# Patient Record
Sex: Male | Born: 1945
Health system: Southern US, Community
[De-identification: ages and names within clinical notes are randomized; demographics above are authoritative.]

## PROBLEM LIST (undated history)

## (undated) DIAGNOSIS — F0390 Unspecified dementia without behavioral disturbance: Secondary | ICD-10-CM

## (undated) DIAGNOSIS — Z973 Presence of spectacles and contact lenses: Secondary | ICD-10-CM

## (undated) DIAGNOSIS — N529 Male erectile dysfunction, unspecified: Secondary | ICD-10-CM

## (undated) DIAGNOSIS — Z87438 Personal history of other diseases of male genital organs: Secondary | ICD-10-CM

## (undated) DIAGNOSIS — M1990 Unspecified osteoarthritis, unspecified site: Secondary | ICD-10-CM

## (undated) DIAGNOSIS — N393 Stress incontinence (female) (male): Secondary | ICD-10-CM

## (undated) DIAGNOSIS — Z9889 Other specified postprocedural states: Secondary | ICD-10-CM

## (undated) DIAGNOSIS — J189 Pneumonia, unspecified organism: Secondary | ICD-10-CM

## (undated) DIAGNOSIS — R972 Elevated prostate specific antigen [PSA]: Secondary | ICD-10-CM

## (undated) DIAGNOSIS — IMO0001 Reserved for inherently not codable concepts without codable children: Secondary | ICD-10-CM

## (undated) DIAGNOSIS — C61 Malignant neoplasm of prostate: Secondary | ICD-10-CM

## (undated) DIAGNOSIS — J45909 Unspecified asthma, uncomplicated: Secondary | ICD-10-CM

## (undated) DIAGNOSIS — Z85828 Personal history of other malignant neoplasm of skin: Secondary | ICD-10-CM

---

## 2004-02-19 ENCOUNTER — Ambulatory Visit (HOSPITAL_COMMUNITY): Admission: RE | Admit: 2004-02-19 | Discharge: 2004-02-19 | Payer: Self-pay | Admitting: Gastroenterology

## 2004-02-19 HISTORY — PX: COLONOSCOPY: SHX174

## 2004-08-24 LAB — HM COLONOSCOPY: HM Colonoscopy: NORMAL

## 2004-09-30 ENCOUNTER — Ambulatory Visit: Payer: Self-pay | Admitting: Sports Medicine

## 2005-05-08 ENCOUNTER — Ambulatory Visit: Payer: Self-pay | Admitting: Family Medicine

## 2005-12-09 ENCOUNTER — Ambulatory Visit: Payer: Self-pay | Admitting: Family Medicine

## 2006-04-30 ENCOUNTER — Ambulatory Visit: Payer: Self-pay | Admitting: Family Medicine

## 2006-08-23 ENCOUNTER — Ambulatory Visit: Payer: Self-pay | Admitting: Family Medicine

## 2006-08-27 ENCOUNTER — Ambulatory Visit: Payer: Self-pay | Admitting: Family Medicine

## 2007-05-24 ENCOUNTER — Encounter: Payer: Self-pay | Admitting: Family Medicine

## 2007-05-24 DIAGNOSIS — J45909 Unspecified asthma, uncomplicated: Secondary | ICD-10-CM | POA: Insufficient documentation

## 2007-05-24 DIAGNOSIS — J309 Allergic rhinitis, unspecified: Secondary | ICD-10-CM | POA: Insufficient documentation

## 2007-06-23 ENCOUNTER — Telehealth: Payer: Self-pay | Admitting: Family Medicine

## 2007-06-27 ENCOUNTER — Telehealth: Payer: Self-pay | Admitting: Family Medicine

## 2007-08-23 ENCOUNTER — Ambulatory Visit: Payer: Self-pay | Admitting: Family Medicine

## 2007-08-23 DIAGNOSIS — H1030 Unspecified acute conjunctivitis, unspecified eye: Secondary | ICD-10-CM

## 2007-08-26 ENCOUNTER — Ambulatory Visit: Payer: Self-pay | Admitting: Family Medicine

## 2007-10-03 ENCOUNTER — Ambulatory Visit: Payer: Self-pay | Admitting: Family Medicine

## 2010-06-03 ENCOUNTER — Telehealth: Payer: Self-pay | Admitting: Family Medicine

## 2010-06-03 DIAGNOSIS — Z85820 Personal history of malignant melanoma of skin: Secondary | ICD-10-CM | POA: Insufficient documentation

## 2010-09-24 NOTE — Progress Notes (Signed)
Summary: pt needs INS referral  Phone Note Call from Patient Call back at Home Phone [phone] Call back at [phone]   Caller: Patient Call For: Nelwyn Salisbury MD Summary of Call: Pt needs a referral to see dr Romeo Apple turner for complete skin check. Pt had melanoma on chest. Initial call taken by: Heron Sabins,  June 03, 2010 2:24 PM  Follow-up for Phone Call        please do so Follow-up by: Nelwyn Salisbury MD,  June 03, 2010 4:42 PM  Additional Follow-up for Phone Call Additional follow up Details #1::        done  Additional Follow-up by: Pura Spice, RN,  June 03, 2010 4:48 PM  New Problems: MELANOMA, HX OF (ICD-V10.82)   New Problems: MELANOMA, HX OF (ICD-V10.82)

## 2010-10-10 ENCOUNTER — Encounter: Payer: Self-pay | Admitting: Family Medicine

## 2010-10-13 ENCOUNTER — Ambulatory Visit (INDEPENDENT_AMBULATORY_CARE_PROVIDER_SITE_OTHER): Payer: Managed Care, Other (non HMO) | Admitting: Family Medicine

## 2010-10-13 ENCOUNTER — Encounter: Payer: Self-pay | Admitting: Family Medicine

## 2010-10-13 VITALS — BP 132/80 | HR 84 | Temp 98.2°F

## 2010-10-13 DIAGNOSIS — L219 Seborrheic dermatitis, unspecified: Secondary | ICD-10-CM

## 2010-10-13 MED ORDER — CICLOPIROX 1 % EX SHAM: 5.0000 mL | MEDICATED_SHAMPOO | Freq: Every day | CUTANEOUS | Status: AC

## 2010-10-13 NOTE — Progress Notes (Signed)
  Subjective:    Patient ID: [name], male    DOB: [birth year], 65 y.o.   MRN: [id number]  HPI Here for 6 month so itching and flaking of the scalp, the ear canals, and the eyebrows. Using Neutragena T Sol with no improvement.    Review of Systems  Constitutional: Negative.   Skin: Positive for rash.       Objective:   Physical Exam  Constitutional: He appears well-developed and well-nourished.  Skin:       Mild flaking of the scalp and eyebrows. No hair loss or erythema.          Assessment & Plan:  Consistent with seborrhea.

## 2011-01-09 NOTE — Op Note (Signed)
NAME:  [name], [name] NO.:  [id number]   MEDICAL RECORD NO.:  [id number]                   PATIENT TYPE:  AMB   LOCATION:  ENDO                                 FACILITY:  [facility]   PHYSICIAN:  John C. Madilyn Fireman, M.D.                 DATE OF BIRTH:  Jul 07, 1946   DATE OF PROCEDURE:  02/19/2004  DATE OF DISCHARGE:                                 OPERATIVE REPORT   PROCEDURE:  Colonoscopy.   INDICATIONS FOR PROCEDURE:  Rectal bleeding.   DESCRIPTION OF PROCEDURE:  The patient was placed in the left lateral  decubitus position then placed on the pulse monitor with continuous low flow  oxygen delivered by nasal cannula. He was sedated with 100 mcg IV fentanyl  and 8 mg IV Versed. The Olympus video colonoscope was inserted into the  rectum and advanced to the cecum, confirmed by transillumination at  McBurney's point and visualization at the ileocecal valve and appendiceal  orifice. The prep was excellent. The cecum, ascending, transverse,  descending and sigmoid colon all appeared normal with no masses, polyps,  diverticula or other mucosal abnormalities. The rectum appeared normal and  retroflexed view of the anus revealed no obvious internal hemorrhoids, but  there were enlarged tender external hemorrhoids appreciated at the beginning  of the procedure with no blood appreciated during the procedure.  The  patient tolerated the procedure well and there were no immediate  complications.   IMPRESSION:  External hemorrhoids otherwise normal colonoscopy with rectal  bleeding almost certainly from hemorrhoids.   PLAN:  Treat hemorrhoids symptomatically.                                               John C. Madilyn Fireman, M.D.    [facility]MED[facility]  D:  02/19/2004  T:  02/19/2004  Job:  [id number]   cc:   Maxwell Caul, M.D.  [address]  [place]  [place] [address]  Fax: [phone]   Bertram Millard. Hyacinth Meeker, M.D.  [address]  [place]  [place] [address]  Fax: [phone]

## 2011-06-02 ENCOUNTER — Telehealth: Payer: Self-pay | Admitting: *Deleted

## 2011-06-02 DIAGNOSIS — L219 Seborrheic dermatitis, unspecified: Secondary | ICD-10-CM

## 2011-06-02 NOTE — Telephone Encounter (Signed)
Tell him I referred him to Dermatology for this

## 2011-06-02 NOTE — Telephone Encounter (Signed)
Notified pt. 

## 2011-06-02 NOTE — Telephone Encounter (Signed)
Pt call stating the Ciclopirox Shampoo is not helping his scalp condition and asking for something different.

## 2011-06-24 ENCOUNTER — Telehealth: Payer: Self-pay | Admitting: *Deleted

## 2011-06-24 DIAGNOSIS — L989 Disorder of the skin and subcutaneous tissue, unspecified: Secondary | ICD-10-CM

## 2011-06-24 NOTE — Telephone Encounter (Signed)
The referral was done  

## 2011-06-24 NOTE — Telephone Encounter (Signed)
Pt is requesting a referral to [facility] for a full body scan on July 14, 2011 at 10: 50 am.

## 2011-06-24 NOTE — Telephone Encounter (Signed)
Spoke with pt

## 2011-08-11 ENCOUNTER — Telehealth: Payer: Self-pay

## 2011-08-11 NOTE — Telephone Encounter (Signed)
Pt called and stated he has been going to [facility] for a carcinoma that is being removed from his scalp on 08/12/11. Camelia Eng will fax pt's information to [facility].  A referral was placed on 06/24/2011.

## 2012-02-12 ENCOUNTER — Telehealth: Payer: Self-pay | Admitting: Family Medicine

## 2012-02-12 NOTE — Telephone Encounter (Signed)
Caller: [name]/Patient; PCP: Nelwyn Salisbury.; CB#: [phone]; ;  Call regarding Has A. Mole That Does Not Look Right and Is Concerned;  He reports that he has a history of Basal Cell Carcinoma - on his chest 2011 and 2012.  He was under the care Dr. Durene Cal at [facility]. With Mole removal. No other treatment required.  He states he has a mole  and just noted its appearence this week 02/08/12. It is very dark in color. Located on the right side of chest above the collar bone. Size - patient describes "between pencil  lead and eraser".  No drainage, no bleeding, no pain, non tender, no itching. Emergent s/sx ruled out per Skin Lesions Protocol with exception of "any new mole" . See in two weeks.  Advised caller I will send Message to the office requesting a follow up.   Cell [phone]  Or home [phone]

## 2012-03-01 ENCOUNTER — Encounter: Payer: Self-pay | Admitting: Family Medicine

## 2012-03-01 ENCOUNTER — Ambulatory Visit (INDEPENDENT_AMBULATORY_CARE_PROVIDER_SITE_OTHER): Payer: Medicare Other | Admitting: Family Medicine

## 2012-03-01 VITALS — BP 130/80 | HR 63 | Temp 98.0°F | Wt 183.0 lb

## 2012-03-01 DIAGNOSIS — L989 Disorder of the skin and subcutaneous tissue, unspecified: Secondary | ICD-10-CM

## 2012-03-02 ENCOUNTER — Encounter: Payer: Self-pay | Admitting: Family Medicine

## 2012-03-02 NOTE — Progress Notes (Signed)
  Subjective:    Patient ID: [name], male    DOB: 05-18-1946, 66 y.o.   MRN: [id number]  HPI Here to check a lesion on the right upper chest that he noticed about 2 weeks ago. He has had 2 basal cell cancers removed, and he is worried about this spot. He sees Dr. Karlyn Agee once a year for skin checks.   Review of Systems  Constitutional: Negative.        Objective:   Physical Exam  Constitutional: He appears well-developed and well-nourished.  Skin:       The right upper chest has a papular lesion which is about 3 mm in diameter. It is well marginated. It has a dark, bluish black color and is surrounded by a thin zone of inflammation           Assessment & Plan:  This is worrisome and could be a melanoma. I advised him to see Dr. Yetta Barre ASAP to have this removed. He will set this up.

## 2012-03-03 DIAGNOSIS — L570 Actinic keratosis: Secondary | ICD-10-CM | POA: Diagnosis not present

## 2012-03-03 DIAGNOSIS — L820 Inflamed seborrheic keratosis: Secondary | ICD-10-CM | POA: Diagnosis not present

## 2012-07-12 DIAGNOSIS — L219 Seborrheic dermatitis, unspecified: Secondary | ICD-10-CM | POA: Diagnosis not present

## 2012-07-12 DIAGNOSIS — Z85828 Personal history of other malignant neoplasm of skin: Secondary | ICD-10-CM | POA: Diagnosis not present

## 2012-07-12 DIAGNOSIS — B356 Tinea cruris: Secondary | ICD-10-CM | POA: Diagnosis not present

## 2012-07-12 DIAGNOSIS — D239 Other benign neoplasm of skin, unspecified: Secondary | ICD-10-CM | POA: Diagnosis not present

## 2012-07-12 DIAGNOSIS — L819 Disorder of pigmentation, unspecified: Secondary | ICD-10-CM | POA: Diagnosis not present

## 2012-07-12 DIAGNOSIS — D1801 Hemangioma of skin and subcutaneous tissue: Secondary | ICD-10-CM | POA: Diagnosis not present

## 2012-07-26 DIAGNOSIS — H43819 Vitreous degeneration, unspecified eye: Secondary | ICD-10-CM | POA: Diagnosis not present

## 2013-04-25 ENCOUNTER — Telehealth: Payer: Self-pay | Admitting: Family Medicine

## 2013-04-25 DIAGNOSIS — IMO0002 Reserved for concepts with insufficient information to code with codable children: Secondary | ICD-10-CM | POA: Diagnosis not present

## 2013-04-25 NOTE — Telephone Encounter (Signed)
I spoke with pt and he is going to a Urgent Care, per advise from Dr. Clent Ridges.
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
## 2013-05-02 ENCOUNTER — Encounter: Payer: Self-pay | Admitting: Family Medicine

## 2013-05-02 ENCOUNTER — Ambulatory Visit (INDEPENDENT_AMBULATORY_CARE_PROVIDER_SITE_OTHER): Payer: Medicare Other | Admitting: Family Medicine

## 2013-05-02 VITALS — BP 150/80 | HR 95 | Temp 97.8°F | Wt 179.0 lb

## 2013-05-02 DIAGNOSIS — R413 Other amnesia: Secondary | ICD-10-CM | POA: Diagnosis not present

## 2013-05-02 DIAGNOSIS — I10 Essential (primary) hypertension: Secondary | ICD-10-CM | POA: Diagnosis not present

## 2013-05-02 LAB — POCT URINALYSIS DIPSTICK
Bilirubin, UA: NEGATIVE
Blood, UA: NEGATIVE
Glucose, UA: NEGATIVE
Nitrite, UA: NEGATIVE
Urobilinogen, UA: 1

## 2013-05-02 LAB — CBC WITH DIFFERENTIAL/PLATELET
Basophils Relative: 0.4 % (ref 0.0–3.0)
Eosinophils Relative: 3.1 % (ref 0.0–5.0)
Lymphocytes Relative: 24.6 % (ref 12.0–46.0)
MCV: 93 fl (ref 78.0–100.0)
Monocytes Absolute: 0.4 10*3/uL (ref 0.1–1.0)
Monocytes Relative: 5.8 % (ref 3.0–12.0)
Neutrophils Relative %: 66.1 % (ref 43.0–77.0)
Platelets: 373 10*3/uL (ref 150.0–400.0)
RBC: 4.71 Mil/uL (ref 4.22–5.81)
WBC: 6.4 10*3/uL (ref 4.5–10.5)

## 2013-05-02 LAB — HEPATIC FUNCTION PANEL
Albumin: 4.3 g/dL (ref 3.5–5.2)
Alkaline Phosphatase: 63 U/L (ref 39–117)
Bilirubin, Direct: 0.1 mg/dL (ref 0.0–0.3)
Total Protein: 7.7 g/dL (ref 6.0–8.3)

## 2013-05-02 LAB — BASIC METABOLIC PANEL
Calcium: 9.7 mg/dL (ref 8.4–10.5)
Creatinine, Ser: 1 mg/dL (ref 0.4–1.5)
GFR: 81.24 mL/min (ref >60.00)
Glucose, Bld: 120 mg/dL — ABNORMAL HIGH (ref 70–99)
Sodium: 139 mEq/L (ref 135–145)

## 2013-05-02 LAB — TSH: TSH: 1.58 u[IU]/mL (ref 0.35–5.50)

## 2013-05-02 MED ORDER — LISINOPRIL 10 MG PO TABS: 10.0000 mg | ORAL_TABLET | Freq: Every day | ORAL | Status: DC

## 2013-05-02 NOTE — Progress Notes (Signed)
  Subjective:    Patient ID: [name], male    DOB: Oct 20, 1945, 67 y.o.   MRN: [id number]  HPI Here for 2 things. First he is concerned about his BP. He was in Urgent Care a few weeks ago for some abrasions after a fall, and his BP that day was 160/90. He has been checking this every few days at his pharmacy since then, and he is consistently getting ranges of 145-160 over 80-94. He feels fine. He eats a very healthy diet and he exercises daily. He does not use tobacco. His father had HTN as he got older. The second issue is his memory. He has noticed a growing problem with forgetting things the past few years and he is very worried about getting dementia. His mother, her sister, and her 2 brothers all had dementia.    Review of Systems  Constitutional: Negative.   Respiratory: Negative.   Cardiovascular: Negative.   Neurological: Negative.        Objective:   Physical Exam  Constitutional: He is oriented to person, place, and time. He appears well-developed and well-nourished.  Neck: No thyromegaly present.  Cardiovascular: Normal rate, regular rhythm, normal heart sounds and intact distal pulses.   Pulmonary/Chest: Effort normal and breath sounds normal.  Lymphadenopathy:    He has no cervical adenopathy.  Neurological: He is alert and oriented to person, place, and time.          Assessment & Plan:  We will start treating the HTN with Lisinopril. Get labs today. Recheck with a cpx in one month. We will refer to Neurology for evaluation.

## 2013-05-04 ENCOUNTER — Encounter: Payer: Self-pay | Admitting: Family Medicine

## 2013-05-04 ENCOUNTER — Ambulatory Visit (INDEPENDENT_AMBULATORY_CARE_PROVIDER_SITE_OTHER): Payer: Medicare Other | Admitting: Family Medicine

## 2013-05-04 VITALS — BP 140/90 | HR 82 | Temp 98.2°F | Wt 179.0 lb

## 2013-05-04 DIAGNOSIS — R21 Rash and other nonspecific skin eruption: Secondary | ICD-10-CM | POA: Diagnosis not present

## 2013-05-04 DIAGNOSIS — I10 Essential (primary) hypertension: Secondary | ICD-10-CM | POA: Diagnosis not present

## 2013-05-04 NOTE — Progress Notes (Signed)
  Subjective:    Patient ID: [name], male    DOB: 06-12-1946, 67 y.o.   MRN: [id number]  HPI Here for the onset yesterday of a rash on both forearms. No itching or pain. No SOB. He is on day 8 of Doxycycline for cellulitis on the knee from an abrasion, and he is on day 3 of Lisinopril for HTN. He had never taken either of these before.    Review of Systems  Constitutional: Negative.   Respiratory: Negative.   Cardiovascular: Negative.   Skin: Positive for rash.       Objective:   Physical Exam  Constitutional: He appears well-developed and well-nourished.  HENT:  Face and lips are not swollen   Cardiovascular: Normal rate, regular rhythm, normal heart sounds and intact distal pulses.   Pulmonary/Chest: Effort normal and breath sounds normal.  Skin:  Macular faint mottled red rash on both inner forearms           Assessment & Plan:  This looks like an allergic reaction, and the timing would suggest it is from the Doxycycline rather than from the Lisinopril. The cellulitis has resolved so he will stop the Doxycycline immediately. He will continue the Lisinopril. If the rash gets worse he will contact us.

## 2013-05-06 ENCOUNTER — Encounter (HOSPITAL_COMMUNITY): Payer: Self-pay

## 2013-05-06 ENCOUNTER — Emergency Department (HOSPITAL_COMMUNITY)
Admission: EM | Admit: 2013-05-06 | Discharge: 2013-05-06 | Disposition: A | Discharge status: Home / Self Care | Payer: Medicare Other | Attending: Emergency Medicine | Admitting: Emergency Medicine

## 2013-05-06 DIAGNOSIS — M629 Disorder of muscle, unspecified: Secondary | ICD-10-CM | POA: Diagnosis not present

## 2013-05-06 DIAGNOSIS — Z87891 Personal history of nicotine dependence: Secondary | ICD-10-CM | POA: Insufficient documentation

## 2013-05-06 DIAGNOSIS — Z79899 Other long term (current) drug therapy: Secondary | ICD-10-CM | POA: Insufficient documentation

## 2013-05-06 DIAGNOSIS — Z880 Allergy status to penicillin: Secondary | ICD-10-CM | POA: Diagnosis not present

## 2013-05-06 DIAGNOSIS — J45909 Unspecified asthma, uncomplicated: Secondary | ICD-10-CM | POA: Diagnosis not present

## 2013-05-06 DIAGNOSIS — R253 Fasciculation: Secondary | ICD-10-CM

## 2013-05-06 DIAGNOSIS — M2420 Disorder of ligament, unspecified site: Secondary | ICD-10-CM | POA: Insufficient documentation

## 2013-05-06 DIAGNOSIS — R259 Unspecified abnormal involuntary movements: Secondary | ICD-10-CM | POA: Diagnosis not present

## 2013-05-06 LAB — POCT I-STAT, CHEM 8
BUN: 11 mg/dL (ref 6–23)
Calcium, Ion: 1.23 mmol/L (ref 1.13–1.30)
Creatinine, Ser: 1 mg/dL (ref 0.50–1.35)
TCO2: 27 mmol/L (ref 0–100)

## 2013-05-06 NOTE — ED Provider Notes (Signed)
CSN: [id number]     Arrival date & time 05/06/13  0506 History   First MD Initiated Contact with Patient 05/06/13 [phone]     Chief Complaint  Patient presents with  . Arm Pain  . Muscle Twitching    (Consider location/radiation/quality/duration/timing/severity/associated sxs/prior Treatment) Patient is a 67 y.o. male presenting with arm pain. The history is provided by the patient. No language interpreter was used.  Arm Pain This is a new problem. The current episode started today.  Pt is a 67 year old male who awoke this morning around 0100 due to difficulty sleeping. He subsequently had left arm pain and muscle twitching in his legs. He denies chest pain, shortness of breath, nausea, difficulty breathing, chest pressure, diaphoresis and recent illness. He reports that he feels fine now however, he is leaving to go on a cruise this afternoon and wants to "get checked out". He reports that he started Lisinopril 10mg  po this last Tuesday and this is the first anti-hypertensive that he has ever taken. He also has a resolving wound on his left knee and left elbow.  Past Medical History  Diagnosis Date  . Allergy   . Asthma    Past Surgical History  Procedure Laterality Date  . Colonoscopy  08/24/04  . Basal cell carcinoma excision      twice per Dr. Karlyn Agee, one on chest and one on scalp   History reviewed. No pertinent family history. History  Substance Use Topics  . Smoking status: Former Games developer  . Smokeless tobacco: Never Used  . Alcohol Use: 1.5 oz/week    3 drink(s) per week    Review of Systems  Musculoskeletal:       Muscles in thighs twitching, left arm pain  All other systems reviewed and are negative.    Allergies  Penicillins and Doxycycline  Home Medications   Current Outpatient Rx  Name  Route  Sig  Dispense  Refill  . lisinopril (PRINIVIL,ZESTRIL) 10 MG tablet   Oral   Take 1 tablet (10 mg total) by mouth daily.   30 tablet   2   . doxycycline  (VIBRA-TABS) 100 MG tablet   Oral   Take 100 mg by mouth 2 (two) times daily.          BP 148/84  Pulse 64  Temp(Src) 98.1 F (36.7 C) (Oral)  Resp 16  SpO2 100% Physical Exam  Constitutional: He is oriented to person, place, and time. He appears well-developed and well-nourished.  HENT:  Head: Normocephalic and atraumatic.  Right Ear: External ear normal.  Left Ear: External ear normal.  Mouth/Throat: Oropharynx is clear and moist.  Eyes: Pupils are equal, round, and reactive to light.  Neck: Normal range of motion. Neck supple.  Cardiovascular: Normal rate, regular rhythm, normal heart sounds and intact distal pulses.   Pulmonary/Chest: Effort normal and breath sounds normal.  Abdominal: Soft. Bowel sounds are normal.  Musculoskeletal: Normal range of motion.  Neurological: He is alert and oriented to person, place, and time.  Skin: Skin is warm and dry.  Psychiatric: He has a normal mood and affect. His behavior is normal. Thought content normal.    ED Course  Procedures (including critical care time) Labs Review Labs Reviewed - No data to display Imaging Review No results found.    05/06/2013   Date: 05/06/2013  Rate: 66   Rhythm: normal sinus rhythm  QRS Axis: normal  Intervals: normal  ST/T Wave abnormalities: normal  Conduction Disutrbances:none  Narrative Interpretation:   Old EKG Reviewed: none available     MDM   1. Muscle twitching      Plan: Follow up with PCP on return from [place] trip for already scheduled PE Reassuring history, negative cardiac markers and normal EKG     Irish Elders, NP 05/06/13 [phone]
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
## 2013-05-06 NOTE — ED Notes (Signed)
Ordered ekg d/t sudden onset arm pain with no obvious injury and elevated b/p.

## 2013-05-06 NOTE — ED Notes (Signed)
Pt states he began on blood pressure medication 3 days ago and he thinks these symptoms may be related to blood pressure medication.  Legs trembling and pain under left arm

## 2013-05-06 NOTE — ED Provider Notes (Signed)
Medical screening examination/treatment/procedure(s) were performed by non-physician practitioner and as supervising physician I was immediately available for consultation/collaboration.   Glynn Octave, MD 05/06/13 [phone]

## 2013-05-08 ENCOUNTER — Telehealth: Payer: Self-pay | Admitting: Family Medicine

## 2013-05-08 NOTE — Telephone Encounter (Signed)
[address] [place], [place] [address] p. [phone] f. [phone] To: [facility] (After Hours Triage) Fax: [phone] From: [facility] Date/ Time: 05/07/2013 1:28 PM Taken By: Jethro BolusMikle Bosworth Facility: home Patient: [name] DOB: 1946-01-25 Phone: [phone] Reason for Call: ATTENTION: !!! [name] (DOB: 1945-11-18) called on Sunday (May 07, 2013 at 1:10 PM) he asked to me to fax a note to Alert Dr. Clent Ridges that was seen in [facility] ED on May 06 2013 in the early AM with a reaction to the Lisinopril. [name] said he was thoroughly checked out and they determined his body was adjusting to the medication and advised to continue taking the Lisinopril. The patient is leaving today (May 07, 2013) for a Cruise and will not be available for contact, and if he had any issues he would see the doctor on the ship. Regarding Appointment: Appt Date: Appt Time: Unknown Confidential Provider: Reason: Details: Outcome:

## 2013-05-08 NOTE — Telephone Encounter (Signed)
noted 

## 2013-05-08 NOTE — Telephone Encounter (Signed)
[facility] Triage Call Report Triage Record Num: [id number] Operator: Peri Jefferson Patient Name: Isamu Trammel Call Date & Time: 05/05/2013 3:35:55PM Patient Phone: [phone] PCP: Tera Mater. Clent Ridges Patient Gender: Male PCP Fax : [phone] Patient DOB: 1945-09-08 Practice Name: [facility] Reason for Call: Triage completed on 05/03/13 @ 5:28pm by Ether Griffins, RN Caller: Matis/Patient; PCP: Gershon Crane (Family Practice); CB#: [phone]; Calling about possible allergic reaction to med--rash on inside of forearms, not itchy. Onset 05/03/13. Started on Lisinopril yesterday for HTN, and on Doxycycline 9 days ago for a skin infection. Guideline: Rash. Disposition: Call Provider Within 4 Hours. Reason for Disposition: New onset rash after beginning new prescribed ,nonprescribed,or alternative/complimentary medication. Contacted Dr Elenora Fender advised to stop Lisinopril and call office in am to be seen. Home care advice given. Protocol(s) Used: Rash Recommended Outcome per Protocol: Call Provider within 4 Hours Reason for Outcome: New onset of rash after beginning new prescribed, nonprescribed, or alternative/complementary medication Care Advice: ~

## 2013-05-19 ENCOUNTER — Encounter: Payer: Self-pay | Admitting: Neurology

## 2013-05-19 ENCOUNTER — Ambulatory Visit (INDEPENDENT_AMBULATORY_CARE_PROVIDER_SITE_OTHER): Payer: Medicare Other | Admitting: Neurology

## 2013-05-19 VITALS — BP 130/70 | HR 66 | Temp 98.0°F | Ht 73.0 in | Wt 180.0 lb

## 2013-05-19 DIAGNOSIS — R413 Other amnesia: Secondary | ICD-10-CM

## 2013-05-19 LAB — HEPATIC FUNCTION PANEL
ALT: 16 U/L (ref 0–53)
AST: 20 U/L (ref 0–37)
Alkaline Phosphatase: 59 U/L (ref 39–117)
Bilirubin, Direct: 0 mg/dL (ref 0.0–0.3)
Total Bilirubin: 0.9 mg/dL (ref 0.3–1.2)

## 2013-05-19 NOTE — Progress Notes (Signed)
NEUROLOGY CONSULTATION NOTE  [name] MRN: [id number] DOB: 1946/06/17  Referring provider: Dr. Clent Ridges Primary care provider: Dr. Clent Ridges  Reason for consult:  Memory problems.  HISTORY OF PRESENT ILLNESS: [name] is a 67 year old right-handed man with hypertension, asthma and history of melanoma who presents for evaluation of memory loss.  Records and images were personally reviewed where available.   He first noticed problems about a year ago.  He says he feels that his brain is "slipping".  He feels he has reduced attention span.  He feels more introverted.  He no longer socializes with his friends as much.  He used to play golf with them, and he has stopped doing it.  He also shies away from social situations, because he feels anxious due to often losing track in conversations, as well as fear of speaking.  He says when he speaks, he notices that he will have trouble saying the last word of a sentence (but not word-finding).  His mother (diagnosed in her 78s), maternal aunt and maternal uncles have history of dementia.  He does report forgetting some conversations.  He will sometimes forget his password to get on his computer.  However, he denies repeating questions or disorientation while driving.  He is able to pay the bills and balance the checkbook.  He is able to perform chores around the house, but chooses not to do them due to lack of interest.  He keeps track of current events.  He is able to keep track while watching TV or reading.  He is an avid reader.  He often plays crossword puzzles or Sudoku.  He exercises regular and enjoys riding his mountain bike, sometimes 11-20 miles every other day.  He does report irritability but not depression.  He retired about 3 years ago.  He used to work at a [employer].  Before that, he was a Publishing copy, worked at a Armed forces logistics/support/administrative officer.  05/02/13 TSH 1.58 Na 139, K 4.6, Cl 103, Glucose 120, BUN 14, Cr 1.0, Ca  9.7, WBC 6.4, Hgb 15, Hct 43.8, PLT 373.  PAST MEDICAL HISTORY: Past Medical History  Diagnosis Date  . Allergy   . Asthma   . HBP (high blood pressure)     PAST SURGICAL HISTORY: Past Surgical History  Procedure Laterality Date  . Colonoscopy  08/24/04  . Basal cell carcinoma excision      twice per Dr. Karlyn Agee, one on chest and one on scalp    MEDICATIONS: Current Outpatient Prescriptions on File Prior to Visit  Medication Sig Dispense Refill  . lisinopril (PRINIVIL,ZESTRIL) 10 MG tablet Take 1 tablet (10 mg total) by mouth daily.  30 tablet  2   No current facility-administered medications on file prior to visit.    ALLERGIES: Allergies  Allergen Reactions  . Penicillins Other (See Comments)    Childhood allergy; reaction unknown  . Doxycycline Rash    FAMILY HISTORY: No family history on file.  SOCIAL HISTORY: History   Social History  . Marital Status: Married    Spouse Name: N/A    Number of Children: N/A  . Years of Education: N/A   Occupational History  . Not on file.   Social History Main Topics  . Smoking status: Never Smoker   . Smokeless tobacco: Never Used  . Alcohol Use: 1.5 oz/week    3 drink(s) per week     Comment: couple of beers a day  .  Drug Use: No  . Sexual Activity: Not on file   Other Topics Concern  . Not on file   Social History Narrative  . No narrative on file    REVIEW OF SYSTEMS: Constitutional: No fevers, chills, or sweats, no generalized fatigue, change in appetite Eyes: No visual changes, double vision, eye pain Ear, nose and throat: No hearing loss, ear pain, nasal congestion, sore throat Cardiovascular: No chest pain, palpitations Respiratory:  No shortness of breath at rest or with exertion, wheezes GastrointestinaI: No nausea, vomiting, diarrhea, abdominal pain, fecal incontinence Genitourinary:  No dysuria, urinary retention or frequency Musculoskeletal:  No neck pain, back pain Integumentary: No rash,  pruritus, skin lesions Neurological: as above Psychiatric: No depression, insomnia, anxiety Endocrine: No palpitations, fatigue, diaphoresis, mood swings, change in appetite, change in weight, increased thirst Hematologic/Lymphatic:  No anemia, purpura, petechiae. Allergic/Immunologic: no itchy/runny eyes, nasal congestion, recent allergic reactions, rashes  PHYSICAL EXAM: Filed Vitals:   05/19/13 0750  BP: 130/70  Pulse: 66  Temp: 98 F (36.7 C)   General: No acute distress Head:  Normocephalic/atraumatic Neck: supple, no paraspinal tenderness, full range of motion Back: No paraspinal tenderness Heart: regular rate and rhythm Lungs: Clear to auscultation bilaterally. Vascular: No carotid bruits. Neurological Exam: Mental status: alert and oriented to person, place, and time, speech fluent and not dysarthric, able to name, repeat, and follow complex commands.  Visuospatial and executive functioning intact (Trail-Making test, copying a cube, drawing a clock), fluency intact, serial 7 subtraction intact, abstraction intact, unable to recall all 5 words after 5 minutes.  MOCA 25/30 (due to inability to recall all 5 words) Cranial nerves: CN I: not tested CN II: pupils equal, round and reactive to light, visual fields intact, fundi unremarkable. CN III, IV, VI:  full range of motion, no nystagmus, no ptosis CN V: facial sensation intact CN VII: upper and lower face symmetric CN VIII: hearing intact CN IX, X: gag intact, uvula midline CN XI: sternocleidomastoid and trapezius muscles intact CN XII: tongue midline Bulk & Tone: normal, no fasciculations. Motor: 5/5 throughout Sensation: pinprick and vibration intact Deep Tendon Reflexes: 2+ throughout, toes down Finger to nose testing: normal Heel to shin: normal Gait: normal stance and stride, able to walk in tandem. Romberg negative.  IMPRESSION & PLAN: Cognitive difficulties, mostly endorses reduced attention span and anxiety.   MOCA positive for delayed recall deficiency.  I am not really getting a clear sense of any cognitive impairment at this point.  He is highly functioning, without any difficulties in performing ADLs.  Although he notes difficulty with attention, he still is able to perform activities requiring attention, such as reading and puzzles.  He did miss all 5 words on MOCA, so further evaluation is warranted. 1.  Check B12 and LFTs 2.  If labs unremarkable, would refer for formal neuropsychological testing, at least for a baseline (given strong family history of dementia). 3.  Re-evaluation in 6 months (or follow up sooner pending results of neuropsychological testing).  45 minutes spent with patient, over 50% spent counseling and coordinating care. Thank you for allowing me to take part in the care of this patient.  Shon Millet, DO  CC:  Gershon Crane, MD

## 2013-05-19 NOTE — Patient Instructions (Signed)
1.  We will first check a vitamin B12 level. 2.  If this is normal, we will schedule for neuropsychological testing. 3.  Follow up should be in 6 months or sooner pending results of above tests.

## 2013-05-25 ENCOUNTER — Telehealth: Payer: Self-pay | Admitting: Neurology

## 2013-05-25 NOTE — Telephone Encounter (Signed)
Left a message for the patient to return my call.  

## 2013-05-25 NOTE — Telephone Encounter (Signed)
Patient calling for results of lab work.  **Dr. Everlena Cooper, labs WNL; Vit B12 low normal. Please advise. Thank you.

## 2013-05-25 NOTE — Telephone Encounter (Signed)
We can check a methylmalonic acid level.

## 2013-05-26 ENCOUNTER — Other Ambulatory Visit: Payer: Self-pay | Admitting: Neurology

## 2013-05-26 DIAGNOSIS — E538 Deficiency of other specified B group vitamins: Secondary | ICD-10-CM

## 2013-05-26 NOTE — Telephone Encounter (Signed)
Spoke with the patient. Will come to our office next week for additional lab work. Order placed in EPIC.

## 2013-06-01 ENCOUNTER — Encounter: Payer: Self-pay | Admitting: Family Medicine

## 2013-06-01 ENCOUNTER — Ambulatory Visit (INDEPENDENT_AMBULATORY_CARE_PROVIDER_SITE_OTHER): Payer: Medicare Other | Admitting: Family Medicine

## 2013-06-01 VITALS — BP 140/70 | HR 69 | Temp 97.6°F | Wt 176.0 lb

## 2013-06-01 DIAGNOSIS — I10 Essential (primary) hypertension: Secondary | ICD-10-CM | POA: Diagnosis not present

## 2013-06-01 LAB — LIPID PANEL
LDL Cholesterol: 115 mg/dL — ABNORMAL HIGH (ref 0–99)
Total CHOL/HDL Ratio: 3

## 2013-06-01 NOTE — Progress Notes (Signed)
  Subjective:    Patient ID: [name], male    DOB: 09-19-1945, 67 y.o.   MRN: [id number]  HPI Here to recheck his BP. He has been on Lisinopril for 4 weeks and has tolerated it well. He has made dramatic dietary changes and has reduced his sodium intake. His BP at home in the past 4 weeks has steadily dropped to where he averages 110/60 now. He is exercising. He asks if he can try stopping the med.    Review of Systems  Constitutional: Negative.   Respiratory: Negative.   Cardiovascular: Negative.        Objective:   Physical Exam  Constitutional: He appears well-developed and well-nourished.  Cardiovascular: Normal rate, regular rhythm, normal heart sounds and intact distal pulses.   Pulmonary/Chest: Effort normal and breath sounds normal.          Assessment & Plan:  He seems to be doing quite well. We will stop the Lisinopril and he will watch the BP closely. Give me a report in 2-3 weeks

## 2013-06-03 ENCOUNTER — Encounter: Payer: Self-pay | Admitting: Neurology

## 2013-06-07 ENCOUNTER — Telehealth: Payer: Self-pay | Admitting: Neurology

## 2013-06-07 DIAGNOSIS — R413 Other amnesia: Secondary | ICD-10-CM

## 2013-06-07 NOTE — Telephone Encounter (Signed)
Spoke with the patient. As per Dr. Moises Blood response in My Chart, will refer [name] to Dr. Leonides Cave. He will call the patient to schedule. No additional questions at this time. Faxing information to Dr. Leonides Cave today.

## 2013-06-14 ENCOUNTER — Encounter: Payer: Self-pay | Admitting: Family Medicine

## 2013-06-14 NOTE — Telephone Encounter (Signed)
These are perfect. Keep things as is

## 2013-06-16 ENCOUNTER — Encounter: Payer: Self-pay | Admitting: Neurology

## 2013-06-29 ENCOUNTER — Encounter: Payer: Self-pay | Admitting: Family Medicine

## 2013-07-03 NOTE — Telephone Encounter (Signed)
These values are excellent. Continue current management

## 2013-07-11 DIAGNOSIS — L739 Follicular disorder, unspecified: Secondary | ICD-10-CM | POA: Diagnosis not present

## 2013-07-11 DIAGNOSIS — L723 Sebaceous cyst: Secondary | ICD-10-CM | POA: Diagnosis not present

## 2013-07-11 DIAGNOSIS — D2370 Other benign neoplasm of skin of unspecified lower limb, including hip: Secondary | ICD-10-CM | POA: Diagnosis not present

## 2013-07-11 DIAGNOSIS — D1801 Hemangioma of skin and subcutaneous tissue: Secondary | ICD-10-CM | POA: Diagnosis not present

## 2013-07-11 DIAGNOSIS — Z85828 Personal history of other malignant neoplasm of skin: Secondary | ICD-10-CM | POA: Diagnosis not present

## 2013-07-11 DIAGNOSIS — L819 Disorder of pigmentation, unspecified: Secondary | ICD-10-CM | POA: Diagnosis not present

## 2013-07-11 DIAGNOSIS — L821 Other seborrheic keratosis: Secondary | ICD-10-CM | POA: Diagnosis not present

## 2013-07-11 DIAGNOSIS — L909 Atrophic disorder of skin, unspecified: Secondary | ICD-10-CM | POA: Diagnosis not present

## 2013-08-07 DIAGNOSIS — H18619 Keratoconus, stable, unspecified eye: Secondary | ICD-10-CM | POA: Diagnosis not present

## 2013-08-16 ENCOUNTER — Telehealth: Payer: Self-pay | Admitting: Family Medicine

## 2013-08-16 DIAGNOSIS — J180 Bronchopneumonia, unspecified organism: Secondary | ICD-10-CM | POA: Diagnosis not present

## 2013-08-16 NOTE — Telephone Encounter (Signed)
Consulted with Dr. Clent Ridges, advised that patient be seen in ED so that they may do x rays, etc.  Contacted pt, pt refuses ED but states he will go to Urgent Care.

## 2013-08-16 NOTE — Telephone Encounter (Signed)
Patient Information:  Caller Name: Joshue  Phone: [phone]  Patient: [name], [name]  Gender: Male  DOB: 1946/04/11  Age: 67 Years  PCP: Gershon Crane [facility])  Office Follow Up:  Does the office need to follow up with this patient?: Yes  Instructions For The Office: Please call and advise  RN Note:  Per ED disposition/RN called Samara Deist who asked for the details to be sent over and she would consult with Dr. Clent Ridges and call pt back to advise.  Symptoms  Reason For Call & Symptoms: Pt is calling to say that on Monday 08/14/13. Pt was taking supplements - one got stuck in his throat and didn't "go down" the right way. It seemed to lodge in his throat for a few minutes. He did not choke at that time. Today he is experiencing a pressure in the middle of his chest since this am ( 2 1/2 hours).  Reviewed Health History In EMR: Yes  Reviewed Medications In EMR: Yes  Reviewed Allergies In EMR: Yes  Reviewed Surgeries / Procedures: Yes  Date of Onset of Symptoms: 08/14/2013  Guideline(s) Used:  Chest Pain  Disposition Per Guideline:   Go to ED Now  Reason For Disposition Reached:   Recent long-distance travel with prolonged time in car, bus, plane, or train (i.e., within past 2 weeks; 6 or more hours duration)  Advice Given:  N/A  RN Overrode Recommendation:  Follow Up With Office Later  Note sent to office with details per their request

## 2013-08-16 NOTE — Telephone Encounter (Signed)
FYI

## 2013-08-18 DIAGNOSIS — T50995A Adverse effect of other drugs, medicaments and biological substances, initial encounter: Secondary | ICD-10-CM | POA: Diagnosis not present

## 2013-09-13 DIAGNOSIS — R413 Other amnesia: Secondary | ICD-10-CM

## 2013-09-25 ENCOUNTER — Encounter: Payer: Self-pay | Admitting: Neurology

## 2013-09-25 NOTE — Telephone Encounter (Signed)
Dr Tomi Likens please advise for this patient

## 2013-09-26 ENCOUNTER — Telehealth: Payer: Self-pay | Admitting: Family Medicine

## 2013-09-26 NOTE — Telephone Encounter (Signed)
FYI

## 2013-09-26 NOTE — Telephone Encounter (Signed)
Patient Information:  Caller Name: Elizabeth  Phone: [phone]  Patient: [name], [name]  Gender: Male  DOB: 1945/09/11  Age: 68 Years  PCP: Alysia Penna [facility])  Office Follow Up:  Does the office need to follow up with this patient?: Yes  Instructions For The Office: Discuss with PCP  RN Note:  Per triage Pt advised that PCP will follow up.  Home care advice and call back information given  Symptoms  Reason For Call & Symptoms: Pt states that he has chills, body aches, nasal congestion.  Low grade temp.  Onset 1/2.  Mild Cough.  No N/V/D.  No rash.  New allergy to Levofloxacin.  Reviewed Health History In EMR: Yes  Reviewed Medications In EMR: Yes  Reviewed Allergies In EMR: Yes  Reviewed Surgeries / Procedures: Yes  Date of Onset of Symptoms: 09/25/2013  Guideline(s) Used:  Influenza - Seasonal  Disposition Per Guideline:   Discuss with PCP and Callback by Nurse within 1 Hour  Reason For Disposition Reached:   HIGH RISK (e.g., age > 18 years, pregnant, HIV+, chronic medical condition) and flu symptoms  Advice Given:  Reassurance  For most healthy adults, influenza feels like a bad cold. The dangers of influenza for normal, healthy people (under 53 years of age) are overrated.  The treatment of influenza depends on your main symptoms. Generally, treatment is the same as for other viral respiratory infections (colds). Bed rest is unnecessary.  Here is some care advice that should help.  Treating the Symptoms of Flu  Fever, Muscle Aches, and Headache: For fever more than 101 F (38.3 C), muscle aches, and headaches, take acetaminophen every 4-6 hours (Adults 650 mg) OR ibuprofen every 6-8 hours (Adults 400-600 mg).  Sore Throat: Use throat lozenges, hard candy or warm chicken broth.  Cough: Use cough drops.  Hydrate: Drink extra liquids. If the air in your home is dry, use a humidifier.  Isolation is Needed Until After the Fever is Gone:   The CDC recommends that  people with influenza-like illness remain at home until at least 24 hours after they are free of fever (100 F or 37.8C).  Do NOT go to work or school.  Do NOT go to church, child care centers, shopping, or other public places.  Do NOT shake hands.  Avoid close contact with others (hugging, kissing).  Call Back If:  You become short of breath or worse.  Expected Course  : The fever lasts 2-3 days, the runny nose 5-10 days, and the cough 2-3 weeks.  Patient Will Follow Care Advice:  YES

## 2013-09-27 ENCOUNTER — Other Ambulatory Visit: Payer: Self-pay | Admitting: Family Medicine

## 2013-09-27 MED ORDER — OSELTAMIVIR PHOSPHATE 75 MG PO CAPS: 75.0000 mg | ORAL_CAPSULE | Freq: Two times a day (BID) | ORAL | Status: DC

## 2013-09-27 NOTE — Telephone Encounter (Signed)
Pt.notified

## 2013-09-27 NOTE — Telephone Encounter (Signed)
Medication sent to [facility] on [address].

## 2013-09-27 NOTE — Telephone Encounter (Signed)
Patient Information:  Caller Name: Oshay  Phone: [phone]  Patient: [name], [name]  Gender: Male  DOB: 1945-09-18  Age: 68 Years  PCP: Alysia Penna [facility])  Office Follow Up:  Does the office need to follow up with this patient?: Yes  Instructions For The Office: Pt is upset, he was advised he would receive a callback from PCP or nurse yesterday 09/26/13 but he did not hear back.   Pt has flu sxs and is considered high risk pt for complications of the flu.  Please follow up with pt ASAP.   Symptoms  Reason For Call & Symptoms: 09/26/13 spoke to triage nurse regarding flu sxs.  Instructed to take Tylenol and Mucinex.  Advised that Dr Sarajane Jews or nurse would call back due to pt being at [place] for complication of the flu but he has not heard back.     States no change in sxs, same as when triaged.  Reviewed Health History In EMR: Yes  Reviewed Medications In EMR: Yes  Reviewed Allergies In EMR: Yes  Reviewed Surgeries / Procedures: Yes  Date of Onset of Symptoms: 09/25/2013  Guideline(s) Used:  No Protocol Available - Sick Adult  Disposition Per Guideline:   Discuss with PCP and Callback by Nurse within 1 Hour  Reason For Disposition Reached:   Nursing judgment  Advice Given:  N/A  Patient Will Follow Care Advice:  YES

## 2013-09-27 NOTE — Telephone Encounter (Signed)
Call in Tamiflu 75 mg bid for 5 days  

## 2013-10-01 DIAGNOSIS — J111 Influenza due to unidentified influenza virus with other respiratory manifestations: Secondary | ICD-10-CM | POA: Diagnosis not present

## 2013-10-01 DIAGNOSIS — J441 Chronic obstructive pulmonary disease with (acute) exacerbation: Secondary | ICD-10-CM | POA: Diagnosis not present

## 2013-10-01 DIAGNOSIS — J209 Acute bronchitis, unspecified: Secondary | ICD-10-CM | POA: Diagnosis not present

## 2013-10-23 DIAGNOSIS — L259 Unspecified contact dermatitis, unspecified cause: Secondary | ICD-10-CM | POA: Diagnosis not present

## 2013-10-23 DIAGNOSIS — L821 Other seborrheic keratosis: Secondary | ICD-10-CM | POA: Diagnosis not present

## 2013-10-23 DIAGNOSIS — L219 Seborrheic dermatitis, unspecified: Secondary | ICD-10-CM | POA: Diagnosis not present

## 2013-10-23 DIAGNOSIS — Z85828 Personal history of other malignant neoplasm of skin: Secondary | ICD-10-CM | POA: Diagnosis not present

## 2013-10-23 DIAGNOSIS — D485 Neoplasm of uncertain behavior of skin: Secondary | ICD-10-CM | POA: Diagnosis not present

## 2013-11-16 ENCOUNTER — Encounter: Payer: Self-pay | Admitting: Neurology

## 2013-11-16 ENCOUNTER — Encounter: Payer: Self-pay | Admitting: Family Medicine

## 2013-11-16 ENCOUNTER — Ambulatory Visit (INDEPENDENT_AMBULATORY_CARE_PROVIDER_SITE_OTHER): Payer: Medicare Other | Admitting: Neurology

## 2013-11-16 VITALS — BP 120/68 | HR 70 | Temp 98.0°F | Resp 16 | Ht 73.0 in | Wt 180.6 lb

## 2013-11-16 DIAGNOSIS — R413 Other amnesia: Secondary | ICD-10-CM | POA: Diagnosis not present

## 2013-11-16 NOTE — Patient Instructions (Signed)
1.  There doesn't seem to be any evidence of cognitive impairment, which is reassuring. 2.  I would like to see you in one year to re-evaluate.  If things seem progressed, we may repeat the test. 3.  Call with questions or concerns if you need to be seen sooner.

## 2013-11-16 NOTE — Progress Notes (Signed)
NEUROLOGY FOLLOW UP OFFICE NOTE  [name] [id number]  HISTORY OF PRESENT ILLNESS: [name] is a 68 year old right-handed man with hypertension, asthma and history of melanoma who follows up for memory loss.  Records and images were personally reviewed where available.    UPDATE: 05/19/13 B12 257, Methylmalonic acid 0.20. He underwent neuropsychological testing on 09/13/13, which did not reveal any indications of cognitive impairment.  He has been doing well.  He reports one instance when he was at an intersection on a familiar route going home.  At the intersection, he forgot which direction to go, for just a moment.  HISTORY: He first noticed problems about a year and a half ago.  He says he feels that his brain is "slipping".  He feels he has reduced attention span.  He feels more introverted.  He no longer socializes with his friends as much.  He used to play golf with them, and he has stopped doing it.  He also shies away from social situations, because he feels anxious due to often losing track in conversations, as well as fear of speaking.  He says when he speaks, he notices that he will have trouble saying the last word of a sentence (but not word-finding).  His mother (diagnosed in her 33s), maternal aunt and maternal uncles have history of dementia.  He does report forgetting some conversations.  He will sometimes forget his password to get on his computer.  However, he denies repeating questions or disorientation while driving.  He is able to pay the bills and balance the checkbook.  He is able to perform chores around the house, but chooses not to do them due to lack of interest.  He keeps track of current events.  He is able to keep track while watching TV or reading.  He is an avid reader.  He often plays crossword puzzles or Sudoku.  He exercises regular and enjoys riding his mountain bike, sometimes 11-20 miles every other day.  He does report irritability but not depression.  He  retired about 3 years ago.  He used to work at a [employer]  Before that, he was a Programmer, systems, worked at a Psychologist, counselling.  MOCA score from 05/19/13 was 25/30, due to inability to recall 5 out of 5 words after 5 minutes.  Suspicion was thought to be secondary to anxiety and reduced attention span.  05/02/13 TSH 1.58  PAST MEDICAL HISTORY: Past Medical History  Diagnosis Date  . Allergy   . Asthma   . HBP (high blood pressure)     MEDICATIONS: Current Outpatient Prescriptions on File Prior to Visit  Medication Sig Dispense Refill  . oseltamivir (TAMIFLU) 75 MG capsule Take 1 capsule (75 mg total) by mouth 2 (two) times daily.  10 capsule  0   No current facility-administered medications on file prior to visit.    ALLERGIES: Allergies  Allergen Reactions  . Lisinopril     Increased B/P tremors   . Penicillins Other (See Comments)    Childhood allergy; reaction unknown  . Doxycycline Rash    FAMILY HISTORY: Family History  Problem Relation Age of Onset  . Dementia Mother   . Dementia Maternal Aunt   . Dementia Maternal Uncle     SOCIAL HISTORY: History   Social History  . Marital Status: Married    Spouse Name: N/A    Number of Children: N/A  . Years of Education: N/A  Occupational History  . Not on file.   Social History Main Topics  . Smoking status: Never Smoker   . Smokeless tobacco: Never Used  . Alcohol Use: 0.0 oz/week     Comment: 1 beer per day  . Drug Use: No  . Sexual Activity: Not on file   Other Topics Concern  . Not on file   Social History Narrative  . No narrative on file    REVIEW OF SYSTEMS: Constitutional: No fevers, chills, or sweats, no generalized fatigue, change in appetite Eyes: No visual changes, double vision, eye pain Ear, nose and throat: No hearing loss, ear pain, nasal congestion, sore throat Cardiovascular: No chest pain, palpitations Respiratory:  No shortness of breath at  rest or with exertion, wheezes GastrointestinaI: No nausea, vomiting, diarrhea, abdominal pain, fecal incontinence Genitourinary:  No dysuria, urinary retention or frequency Musculoskeletal:  No neck pain, back pain Integumentary: No rash, pruritus, skin lesions Neurological: as above Psychiatric: No depression, insomnia, anxiety Endocrine: No palpitations, fatigue, diaphoresis, mood swings, change in appetite, change in weight, increased thirst Hematologic/Lymphatic:  No anemia, purpura, petechiae. Allergic/Immunologic: no itchy/runny eyes, nasal congestion, recent allergic reactions, rashes  PHYSICAL EXAM: Filed Vitals:   11/16/13 0859  BP: 120/68  Pulse: 70  Temp: 98 F (36.7 C)  Resp: 16   General: No acute distress Head:  Normocephalic/atraumatic Neck: supple, no paraspinal tenderness, full range of motion Heart:  Regular rate and rhythm Lungs:  Clear to auscultation bilaterally Back: No paraspinal tenderness Neurological Exam: alert and oriented to person, place, and time (except date). Attention span and concentration intact, recent and remote memory intact, fund of knowledge intact.  Recalled 2 of 3 words.  Serial 7 subtraction intact. Speech fluent and not dysarthric, able to name, write, read, name and follow 3 step commands across midline.  Able to copy intersecting pentagons.  MMSE 28/30.  CN II-XII intact. Fundoscopic exam unremarkable without vessel changes, exudates, hemorrhages or papilledema.  Bulk and tone normal, muscle strength 5/5 throughout.  Sensation to light touch, temperature and vibration intact.  Deep tendon reflexes 2+ throughout, toes downgoing.  Finger to nose and heel to shin testing intact.  Gait normal, Romberg negative.  IMPRESSION: Memory problems.  No organic impairment noted on testing.  PLAN: 1.  Will follow up in one year to re-evaluate.  If symptoms seem progressed, may repeat neuropsychological testing.  Will seen sooner if needed.  30  minutes spent with patient, over 50% spent discussing neuropsych results, counseling and coordinating care.  Metta Clines, DO  CC:  Alysia Penna, MD

## 2014-01-05 ENCOUNTER — Encounter: Payer: Self-pay | Admitting: Family Medicine

## 2014-01-05 MED ORDER — CIPROFLOXACIN HCL 500 MG PO TABS: 500.0000 mg | ORAL_TABLET | Freq: Two times a day (BID) | ORAL | Status: DC

## 2014-01-05 NOTE — Telephone Encounter (Signed)
Call in Cipro 500 mg bid for 10 days  

## 2014-01-08 ENCOUNTER — Ambulatory Visit (INDEPENDENT_AMBULATORY_CARE_PROVIDER_SITE_OTHER): Payer: Medicare Other | Admitting: Family Medicine

## 2014-01-08 DIAGNOSIS — Z23 Encounter for immunization: Secondary | ICD-10-CM | POA: Diagnosis not present

## 2014-01-16 ENCOUNTER — Encounter: Payer: Self-pay | Admitting: Family Medicine

## 2014-01-16 ENCOUNTER — Ambulatory Visit (INDEPENDENT_AMBULATORY_CARE_PROVIDER_SITE_OTHER): Payer: Medicare Other | Admitting: Family Medicine

## 2014-01-16 VITALS — BP 152/84 | HR 74 | Temp 97.8°F | Ht 73.0 in | Wt 179.0 lb

## 2014-01-16 DIAGNOSIS — N453 Epididymo-orchitis: Secondary | ICD-10-CM | POA: Diagnosis not present

## 2014-01-16 DIAGNOSIS — N451 Epididymitis: Secondary | ICD-10-CM

## 2014-01-16 MED ORDER — CIPROFLOXACIN HCL 500 MG PO TABS: 500.0000 mg | ORAL_TABLET | Freq: Two times a day (BID) | ORAL | Status: DC

## 2014-01-16 NOTE — Progress Notes (Signed)
Pre visit review using our clinic review tool, if applicable. No additional management support is needed unless otherwise documented below in the visit note. 

## 2014-01-16 NOTE — Progress Notes (Signed)
   Subjective:    Patient ID: [name], male    DOB: [birth year], 68 y.o.   MRN: [id number]  HPI Here for 3 days of mild pain at the bottom of the left testicle. No recent trauma but he has completed 2 bike rides in the past few weeks of 9 miles and 13 miles. He has no swelling. No urinary sx.    Review of Systems  Constitutional: Negative.   Genitourinary: Positive for testicular pain. Negative for dysuria, urgency, frequency, hematuria, flank pain, discharge, scrotal swelling and difficulty urinating.       Objective:   Physical Exam  Constitutional: He appears well-developed and well-nourished.  Genitourinary:  Mildly tender along the left inferior epididymis. No swelling. No torsion          Assessment & Plan:  Rest, drink plenty of water, treat with Cipro.

## 2014-01-30 ENCOUNTER — Ambulatory Visit (INDEPENDENT_AMBULATORY_CARE_PROVIDER_SITE_OTHER): Payer: Medicare Other | Admitting: Family Medicine

## 2014-01-30 ENCOUNTER — Encounter: Payer: Self-pay | Admitting: Family Medicine

## 2014-01-30 VITALS — BP 150/85 | HR 66 | Temp 98.6°F | Ht 73.0 in | Wt 180.0 lb

## 2014-01-30 DIAGNOSIS — N509 Disorder of male genital organs, unspecified: Secondary | ICD-10-CM

## 2014-01-30 DIAGNOSIS — N390 Urinary tract infection, site not specified: Secondary | ICD-10-CM | POA: Diagnosis not present

## 2014-01-30 DIAGNOSIS — N50819 Testicular pain, unspecified: Secondary | ICD-10-CM

## 2014-01-30 LAB — POCT URINALYSIS DIPSTICK
BILIRUBIN UA: NEGATIVE
Blood, UA: NEGATIVE
Glucose, UA: NEGATIVE
KETONES UA: NEGATIVE
Nitrite, UA: NEGATIVE
PH UA: 7.5
PROTEIN UA: NEGATIVE
Urobilinogen, UA: 0.2

## 2014-01-30 MED ORDER — CIPROFLOXACIN HCL 500 MG PO TABS: 500.0000 mg | ORAL_TABLET | Freq: Two times a day (BID) | ORAL | Status: DC

## 2014-01-30 NOTE — Progress Notes (Signed)
Pre visit review using our clinic review tool, if applicable. No additional management support is needed unless otherwise documented below in the visit note. 

## 2014-01-30 NOTE — Progress Notes (Signed)
   Subjective:    Patient ID: [name], male    DOB: 08-Aug-1946, 68 y.o.   MRN: [id number]  HPI Here to follow up on left testicle pain. He was here about 2 weeks ago and on exam his inferior left epididymus was tender. He was placed on 10 days of Cipro, which he finished about 5 days ago. This seemed to help quite a bit and the testicle is now only mildly tender. He has some slightly increased urge to urinate. No blood seen or DC.    Review of Systems  Constitutional: Negative.   Genitourinary: Positive for urgency, frequency and testicular pain. Negative for dysuria, hematuria, flank pain, scrotal swelling, difficulty urinating, genital sores and penile pain.       Objective:   Physical Exam  Constitutional: He appears well-developed and well-nourished. No distress.  Genitourinary: Penis normal.  The inferior left epididymus is slightly tender only, no swelling. This is much improved from the last visit.           Assessment & Plan:  Partially treated epididymitis. We will get back on Cipro but treat for the next 3 weeks.

## 2014-02-01 LAB — URINE CULTURE
COLONY COUNT: NO GROWTH
Organism ID, Bacteria: NO GROWTH

## 2014-02-20 ENCOUNTER — Encounter: Payer: Self-pay | Admitting: Family Medicine

## 2014-02-25 ENCOUNTER — Encounter: Payer: Self-pay | Admitting: Family Medicine

## 2014-02-26 ENCOUNTER — Telehealth: Payer: Self-pay | Admitting: Family Medicine

## 2014-02-26 NOTE — Telephone Encounter (Signed)
Pt called to fu on appt request sent this am.  Pt states he cancelled trip to [place]. Pt has had issue w/ testicle that has gotten worse. Pt would like appt today if possible.  However. Schedule is closed for dr fry.  Scheduled pt for 9:30 in the am, unless we can work pt in today. Pls advise.

## 2014-02-26 NOTE — Telephone Encounter (Signed)
I spoke with pt and he will keep appointment on 02/27/14.

## 2014-02-27 ENCOUNTER — Encounter: Payer: Self-pay | Admitting: Family Medicine

## 2014-02-27 ENCOUNTER — Ambulatory Visit (INDEPENDENT_AMBULATORY_CARE_PROVIDER_SITE_OTHER): Payer: Medicare Other | Admitting: Family Medicine

## 2014-02-27 VITALS — BP 132/94 | Temp 97.7°F | Ht 73.0 in | Wt 174.0 lb

## 2014-02-27 DIAGNOSIS — N509 Disorder of male genital organs, unspecified: Secondary | ICD-10-CM | POA: Diagnosis not present

## 2014-02-27 DIAGNOSIS — N50819 Testicular pain, unspecified: Secondary | ICD-10-CM

## 2014-02-27 MED ORDER — SULFAMETHOXAZOLE-TMP DS 800-160 MG PO TABS: 1.0000 | ORAL_TABLET | Freq: Two times a day (BID) | ORAL | Status: DC

## 2014-02-27 NOTE — Progress Notes (Signed)
Pre visit review using our clinic review tool, if applicable. No additional management support is needed unless otherwise documented below in the visit note. 

## 2014-02-27 NOTE — Progress Notes (Signed)
   Subjective:    Patient ID: [name], male    DOB: 10-21-1945, 68 y.o.   MRN: [id number]  HPI Here for continued testicle pain. This started in the left testicle but has now spread to the right one as well. He feels better when taking Cipro but the pain never goes away.    Review of Systems  Constitutional: Negative.   Genitourinary: Positive for testicular pain. Negative for dysuria, hematuria, discharge, penile swelling, scrotal swelling and penile pain.       Objective:   Physical Exam  Constitutional: He appears well-developed and well-nourished.  Genitourinary:  Both epididymi are tender, no swelling or masses           Assessment & Plan:  Try Bactrim DS and refer to Urology

## 2014-02-27 NOTE — Telephone Encounter (Signed)
He was seen today

## 2014-03-02 DIAGNOSIS — N400 Enlarged prostate without lower urinary tract symptoms: Secondary | ICD-10-CM | POA: Diagnosis not present

## 2014-03-02 DIAGNOSIS — N509 Disorder of male genital organs, unspecified: Secondary | ICD-10-CM | POA: Diagnosis not present

## 2014-03-06 ENCOUNTER — Encounter: Payer: Self-pay | Admitting: Family Medicine

## 2014-03-27 ENCOUNTER — Other Ambulatory Visit: Payer: Medicare Other

## 2014-04-03 ENCOUNTER — Encounter: Payer: Medicare Other | Admitting: Family Medicine

## 2014-04-11 ENCOUNTER — Encounter: Payer: Medicare Other | Admitting: Family Medicine

## 2014-04-13 DIAGNOSIS — R972 Elevated prostate specific antigen [PSA]: Secondary | ICD-10-CM | POA: Diagnosis not present

## 2014-04-27 ENCOUNTER — Other Ambulatory Visit: Payer: Self-pay | Admitting: Urology

## 2014-05-01 ENCOUNTER — Other Ambulatory Visit: Payer: Self-pay | Admitting: Urology

## 2014-05-10 ENCOUNTER — Encounter (HOSPITAL_BASED_OUTPATIENT_CLINIC_OR_DEPARTMENT_OTHER): Payer: Self-pay | Admitting: *Deleted

## 2014-05-10 NOTE — Progress Notes (Signed)
NPO AFTER MN. ARRIVE AT 0800. NEEDS ISTAT 8.  WILL DO FLEET ENEMA AM DOS.

## 2014-05-15 NOTE — Anesthesia Preprocedure Evaluation (Addendum)
Anesthesia Evaluation  Patient identified by MRN, date of birth, ID band Patient awake    Reviewed: Allergy & Precautions, H&P , NPO status , Patient's Chart, lab work & pertinent test results  History of Anesthesia Complications Negative for: history of anesthetic complications  Airway Mallampati: II TM Distance: >3 FB Neck ROM: Full    Dental no notable dental hx. (+)    Pulmonary neg pulmonary ROS,  Childhood asthma breath sounds clear to auscultation  Pulmonary exam normal       Cardiovascular Exercise Tolerance: Good hypertension, Rhythm:Regular Rate:Normal  whitecoat HTN, not currently taking any medications   Neuro/Psych  Headaches, negative psych ROS   GI/Hepatic negative GI ROS, Neg liver ROS,   Endo/Other  negative endocrine ROS  Renal/GU negative Renal ROS  negative genitourinary   Musculoskeletal  (+) Arthritis -,   Abdominal   Peds negative pediatric ROS (+)  Hematology negative hematology ROS (+)   Anesthesia Other Findings   Reproductive/Obstetrics negative OB ROS                          Anesthesia Physical Anesthesia Plan  ASA: II  Anesthesia Plan: General   Post-op Pain Management:    Induction: Intravenous  Airway Management Planned: LMA  Additional Equipment:   Intra-op Plan:   Post-operative Plan: Extubation in OR  Informed Consent: I have reviewed the patients History and Physical, chart, labs and discussed the procedure including the risks, benefits and alternatives for the proposed anesthesia with the patient or authorized representative who has indicated his/her understanding and acceptance.   Dental advisory given  Plan Discussed with: CRNA  Anesthesia Plan Comments:         Anesthesia Quick Evaluation

## 2014-05-18 ENCOUNTER — Encounter (HOSPITAL_BASED_OUTPATIENT_CLINIC_OR_DEPARTMENT_OTHER): Payer: Medicare Other | Admitting: Anesthesiology

## 2014-05-18 ENCOUNTER — Encounter (HOSPITAL_BASED_OUTPATIENT_CLINIC_OR_DEPARTMENT_OTHER): Payer: Self-pay

## 2014-05-18 ENCOUNTER — Ambulatory Visit (HOSPITAL_BASED_OUTPATIENT_CLINIC_OR_DEPARTMENT_OTHER): Payer: Medicare Other | Admitting: Anesthesiology

## 2014-05-18 ENCOUNTER — Encounter (HOSPITAL_BASED_OUTPATIENT_CLINIC_OR_DEPARTMENT_OTHER)
Admission: RE | Disposition: A | Discharge status: Home / Self Care | Payer: Self-pay | Source: Ambulatory Visit | Attending: Urology

## 2014-05-18 ENCOUNTER — Ambulatory Visit (HOSPITAL_BASED_OUTPATIENT_CLINIC_OR_DEPARTMENT_OTHER)
Admission: RE | Admit: 2014-05-18 | Discharge: 2014-05-18 | Disposition: A | Discharge status: Home / Self Care | Payer: Medicare Other | Source: Ambulatory Visit | Attending: Urology | Admitting: Urology

## 2014-05-18 DIAGNOSIS — Z881 Allergy status to other antibiotic agents status: Secondary | ICD-10-CM | POA: Diagnosis not present

## 2014-05-18 DIAGNOSIS — R972 Elevated prostate specific antigen [PSA]: Secondary | ICD-10-CM | POA: Diagnosis not present

## 2014-05-18 DIAGNOSIS — C61 Malignant neoplasm of prostate: Secondary | ICD-10-CM | POA: Diagnosis not present

## 2014-05-18 DIAGNOSIS — Z880 Allergy status to penicillin: Secondary | ICD-10-CM | POA: Diagnosis not present

## 2014-05-18 HISTORY — DX: Elevated prostate specific antigen (PSA): R97.20

## 2014-05-18 HISTORY — PX: PROSTATE BIOPSY: SHX241

## 2014-05-18 HISTORY — DX: Malignant neoplasm of prostate: C61

## 2014-05-18 HISTORY — DX: Unspecified osteoarthritis, unspecified site: M19.90

## 2014-05-18 HISTORY — DX: Presence of spectacles and contact lenses: Z97.3

## 2014-05-18 HISTORY — DX: Other specified postprocedural states: Z85.828

## 2014-05-18 HISTORY — DX: Personal history of other malignant neoplasm of skin: Z98.890

## 2014-05-18 HISTORY — DX: Personal history of other diseases of male genital organs: Z87.438

## 2014-05-18 HISTORY — DX: Reserved for inherently not codable concepts without codable children: IMO0001

## 2014-05-18 LAB — POCT I-STAT, CHEM 8
BUN: 13 mg/dL (ref 6–23)
CHLORIDE: 105 meq/L (ref 96–112)
CREATININE: 1.2 mg/dL (ref 0.50–1.35)
Calcium, Ion: 1.21 mmol/L (ref 1.13–1.30)
Glucose, Bld: 109 mg/dL — ABNORMAL HIGH (ref 70–99)
HEMATOCRIT: 44 % (ref 39.0–52.0)
HEMOGLOBIN: 15 g/dL (ref 13.0–17.0)
Potassium: 3.6 mEq/L — ABNORMAL LOW (ref 3.7–5.3)
SODIUM: 140 meq/L (ref 137–147)
TCO2: 24 mmol/L (ref 0–100)

## 2014-05-18 SURGERY — BIOPSY, PROSTATE, RECTAL APPROACH, WITH US GUIDANCE
Anesthesia: General | Site: Prostate

## 2014-05-18 MED ORDER — PROPOFOL 10 MG/ML IV BOLUS
INTRAVENOUS | Status: DC | PRN
  Administered 2014-05-18: 200 mg via INTRAVENOUS
  Administered 2014-05-18 (×2): 100 mg via INTRAVENOUS

## 2014-05-18 MED ORDER — GENTAMICIN SULFATE 40 MG/ML IJ SOLN
240.0000 mg | Freq: Once | INTRAVENOUS | Status: DC
  Filled 2014-05-18: qty 6

## 2014-05-18 MED ORDER — LIDOCAINE HCL (CARDIAC) 20 MG/ML IV SOLN
INTRAVENOUS | Status: DC | PRN
  Administered 2014-05-18: 100 mg via INTRAVENOUS

## 2014-05-18 MED ORDER — MIDAZOLAM HCL 2 MG/2ML IJ SOLN
INTRAMUSCULAR | Status: AC
  Filled 2014-05-18: qty 2

## 2014-05-18 MED ORDER — GENTAMICIN SULFATE 40 MG/ML IJ SOLN
5.0000 mg/kg | Freq: Once | INTRAMUSCULAR | Status: AC
  Administered 2014-05-18: 400 mg via INTRAVENOUS
  Filled 2014-05-18: qty 10

## 2014-05-18 MED ORDER — MIDAZOLAM HCL 5 MG/5ML IJ SOLN
INTRAMUSCULAR | Status: DC | PRN
  Administered 2014-05-18 (×2): 1 mg via INTRAVENOUS

## 2014-05-18 MED ORDER — FLEET ENEMA 7-19 GM/118ML RE ENEM
1.0000 | ENEMA | Freq: Once | RECTAL | Status: DC
  Filled 2014-05-18: qty 1

## 2014-05-18 MED ORDER — FENTANYL CITRATE 0.05 MG/ML IJ SOLN
INTRAMUSCULAR | Status: AC
  Filled 2014-05-18: qty 6

## 2014-05-18 MED ORDER — FENTANYL CITRATE 0.05 MG/ML IJ SOLN
INTRAMUSCULAR | Status: DC | PRN
  Administered 2014-05-18 (×4): 25 ug via INTRAVENOUS

## 2014-05-18 MED ORDER — DIAZEPAM 5 MG PO TABS: 5.0000 mg | ORAL_TABLET | Freq: Every evening | ORAL | Status: DC | PRN

## 2014-05-18 MED ORDER — FENTANYL CITRATE 0.05 MG/ML IJ SOLN
25.0000 ug | INTRAMUSCULAR | Status: DC | PRN
  Filled 2014-05-18: qty 1

## 2014-05-18 MED ORDER — LACTATED RINGERS IV SOLN
INTRAVENOUS | Status: DC
  Administered 2014-05-18: 08:00:00 via INTRAVENOUS
  Filled 2014-05-18: qty 1000

## 2014-05-18 MED ORDER — DEXAMETHASONE SODIUM PHOSPHATE 4 MG/ML IJ SOLN
INTRAMUSCULAR | Status: DC | PRN
  Administered 2014-05-18: 10 mg via INTRAVENOUS

## 2014-05-18 MED ORDER — LACTATED RINGERS IV SOLN
INTRAVENOUS | Status: DC | PRN
  Administered 2014-05-18 (×2): via INTRAVENOUS

## 2014-05-18 MED ORDER — SUCCINYLCHOLINE CHLORIDE 20 MG/ML IJ SOLN
INTRAMUSCULAR | Status: DC | PRN
  Administered 2014-05-18: 100 mg via INTRAVENOUS

## 2014-05-18 MED ORDER — GLYCOPYRROLATE 0.2 MG/ML IJ SOLN
INTRAMUSCULAR | Status: DC | PRN
  Administered 2014-05-18: 0.2 mg via INTRAVENOUS

## 2014-05-18 MED ORDER — ACETAMINOPHEN 10 MG/ML IV SOLN
INTRAVENOUS | Status: DC | PRN
  Administered 2014-05-18: 1000 mg via INTRAVENOUS

## 2014-05-18 MED ORDER — PROMETHAZINE HCL 25 MG/ML IJ SOLN
6.2500 mg | INTRAMUSCULAR | Status: DC | PRN
  Filled 2014-05-18: qty 1

## 2014-05-18 MED ORDER — ACETAMINOPHEN-CODEINE #3 300-30 MG PO TABS: 1.0000 | ORAL_TABLET | ORAL | Status: DC | PRN

## 2014-05-18 MED ORDER — LIDOCAINE HCL 2 % IJ SOLN
INTRAMUSCULAR | Status: DC | PRN
  Administered 2014-05-18: 10 mL

## 2014-05-18 MED ORDER — EPHEDRINE SULFATE 50 MG/ML IJ SOLN
INTRAMUSCULAR | Status: DC | PRN
  Administered 2014-05-18 (×3): 10 mg via INTRAVENOUS

## 2014-05-18 MED ORDER — ONDANSETRON HCL 4 MG/2ML IJ SOLN
INTRAMUSCULAR | Status: DC | PRN
  Administered 2014-05-18: 4 mg via INTRAVENOUS

## 2014-05-18 SURGICAL SUPPLY — 9 items
DRSG TELFA 3X8 NADH (GAUZE/BANDAGES/DRESSINGS) IMPLANT
GLOVE BIO SURGEON STRL SZ7.5 (GLOVE) ×1 IMPLANT
NDL SPNL 22GX7 QUINCKE BK (NEEDLE) IMPLANT
NEEDLE SPNL 22GX7 QUINCKE BK (NEEDLE) IMPLANT
PAD DRESSING TELFA 3X8 NADH (GAUZE/BANDAGES/DRESSINGS) IMPLANT
SURGILUBE 2OZ TUBE FLIPTOP (MISCELLANEOUS) ×2 IMPLANT
SYR CONTROL 10ML LL (SYRINGE) IMPLANT
TOWEL OR 17X24 6PK STRL BLUE (TOWEL DISPOSABLE) IMPLANT
UNDERPAD 30X30 INCONTINENT (UNDERPADS AND DIAPERS) ×2 IMPLANT

## 2014-05-18 NOTE — Op Note (Signed)
Procedure  Procedure: Ultrasound Guided Prostate needle biopsy.  Indication: Elevated PSA.  Consent: from the patient . Specific risks including, but not limited to bleeding, infection, pain, allergic reaction, failure to diagnose cancer, and urinary retention were explained.  The patient was premedicated with Procedure performed the OR under general anesthesia.  Prep: Fleets enema.  Local Anesthesia: A periprostatic nerve block was performed with 10 cc of 2% Xylocaine  Antibiotic prophylaxis: Trimethoprim/Sulfamethoxazole, Gentamicin  Procedure Note:  The patient was placed in the lateral decubitus position. The prostate size was estimated to be 1+ by digital rectal exam. The prostate had a palpable nodule (asymmetric prostate with firm left lobe) (left mid ). The 10 MHz transrectal ultrasound probe was placed into the rectum. Prostate width measured 5.33cm, height of 3.27cm and length of 4.60cm . Prostate volume was measured to be 42 cc. Hypoechoic lesions were noted (left mid ) . A median lobe was present. The biopsy cores were obtained using direct, real-time ultrasound guidance utilizing a standard 12-core pattern with one core from the right apex lateral using real time ultrasound guidance to direct the biopsy core being taken from this location, right apex medial using real time ultrasound guidance to direct the biopsy core being taken from this location, left apex lateral using real time ultrasound guidance to direct the biopsy core being taken from this location, left apex medial using real time ultrasound guidance to direct the biopsy core being taken from this location, right mid lateral using real time ultrasound guidance to direct the biopsy core being taken from this location, right mid medial using real time ultrasound guidance to direct the biopsy core being taken from this location, left mid lateral using real time ultrasound guidance to direct the biopsy core being taken from this  location, left mid medial using real time ultrasound guidance to direct the biopsy core being taken from this location, right base lateral using real time ultrasound guidance to direct the biopsy core being taken from this location, right base medial using real time ultrasound guidance to direct the biopsy core being taken from this location, left base lateral using real time ultrasound guidance to direct the biopsy core being taken from this location and left base medial of the prostate using real time ultrasound guidance to direct the biopsy core being taken from this location. Additional cores were also taken using real time ultrasound guidance to direct the biopsy core being taken from this location (two extra cores were taken in the left base/mid transitional zone) (left mid () and transition zone () ). The biopsy cores were placed in buffered formalin and sent to pathlogy.  Patient Status:. The patient tolerated the procedure well.  Complications:. None.

## 2014-05-18 NOTE — H&P (Signed)
Reason For Visit elevated PSA   History of Present Illness 37M referred by Dr. Alysia Penna initially for chronic testicular pain. He was diagnosed with non-bacterial epididymo-orchitis and recommended NSAIDs, Ice and scrotal elevation. These symptoms have improved significantly. We also discussed prostate cancer screening. PSA was checked at that point and was considered to be elevated to 14.46. He presents today to discuss implications of elevated PSA and further management.  He has no family history of prostate cancer. The patient denies any bone pain, new back pain, or lower extremity edema. The patient denies any changes in his voiding symptoms over the last 6 months. Specifically he denies dysuria or hematuria.    PSA History:  14.46  IPSS: IPS 1, QoL 2  SHIM: 22     Past Medical History Problems  1. History of arthritis (V13.4)  Surgical History Problems  1. History of No Surgical Problems  Current Meds 1. No Reported Medications Recorded  Allergies Medication  1. Doxycycline Hyclate CAPS 2. levofloxacin 3. Penicillins  Family History Problems  1. No pertinent family history : Mother  Social History Problems  1. Alcohol use (V49.89) 2. Caffeine use (V49.89) 3. Married 4. Never a smoker  Review of Systems No changes in pts bowel habits, neurological changes, or progressive lower urinary tract symptoms.    Vitals Vital Signs [Data Includes: Last 1 Day]  Recorded: 21Aug2015 03:28PM  Blood Pressure: 171 / 81 Temperature: 97.5 F Heart Rate: 59  Results/Data Urine [Data Includes: Last 1 Day]   21Aug2015  COLOR STRAW   APPEARANCE CLEAR   SPECIFIC GRAVITY <1.005   pH 6.5   GLUCOSE NEG mg/dL  BILIRUBIN NEG   KETONE NEG mg/dL  BLOOD NEG   PROTEIN NEG mg/dL  UROBILINOGEN 0.2 mg/dL  NITRITE NEG   LEUKOCYTE ESTERASE NEG    Urinalysis reveals no evidence of infection or inflammation   Assessment Elevated PSA   Plan Elevated prostate specific  antigen (PSA)  1. Start: Levofloxacin 500 MG Oral Tablet (Levaquin); Take one tablet daily starting day  before procedure 2. Follow-up After Test Office  Follow-up  Status: Hold For - Appointment,Date of Service   Requested for: 21Aug2015 3. MISCELLANEOUS TEST; Status:In Progress - Specimen/Data Collected;   Done:  21Aug2015 4. PROSTATE BIOPSY ULTRASOUND; Status:Hold For - Date of Service; Requested  for:14Sep2015;  5. VENIPUNCTURE; Status:Complete;   Done: [id number]  Discussion/Summary Using the patient's age, PSA,DRE findings, family history, and previous biopsies history the information was entered into a prostate cancer risk nomogram. Based on the prostate cancer risk calculated from the prostate cancer prevention trial the patient's biospy outcomes are:    Chance for a negative biopsy: 41  Chance for low-grade prostate cancer: 54  Chance for high-grade prostate cancer requiring treatment: 19    Based on these numbers I recommended the patient undergo a TRUS\prostate biopsy. However, I do think we should repeat his PSA today. The patient himself has done significant amount of prostate cancer research and would like to obtain a prostate health inventory, PHI test. I ordered this today and will follow up with him with the results. We did discuss a prostate biopsy.    I explained the prostate biopsy procedure detail. The patient understands that we will perform a transrectal ultrasound first.  This will be followed by numbing the prostate using local anesthesia, and then proceeding with at least a 12 core biopsies. He understands the risk of prostate biopsy which include approximately 2% chance of developing a systemic  infection requiring IV antibiotics. He is also aware that he will likely have blood in his urine and stool for at least 48 hours up to 2 weeks. I also explained that he is likely to have blood in his ejaculate for up to 6 weeks.    Having been given all the information  including the risks of prostate cancer as well as the risks of prostate biopsy the patient has agreed to proceed. We'll schedule his biopsy for him at his earliest convenience.

## 2014-05-18 NOTE — Discharge Instructions (Signed)
Transrectal Ultrasound-Guided Biopsy A transrectal ultrasound-guided biopsy is a procedure to remove samples of tissue from your prostate using ultrasound images to guide the procedure. The procedure is usually done to evaluate the prostate gland of men who have an elevated prostate-specific antigen (PSA). PSA is a blood test to screen for prostate cancer. The biopsy samples are taken to check for prostate cancer.  LET [facility] CARE PROVIDER KNOW ABOUT:  Any allergies you have.  All medicines you are taking, including vitamins, herbs, eye drops, creams, and over-the-counter medicines.  Previous problems you or members of your family have had with the use of anesthetics.  Any blood disorders you have.  Previous surgeries you have had.  Medical conditions you have. RISKS AND COMPLICATIONS Generally, this is a safe procedure. However, as with any procedure, problems can occur. Possible problems include:  Infection of your prostate.  Bleeding from your rectum or blood in your urine.  Difficulty urinating.  Nerve damage (this is usually temporary).  Damage to surrounding structures such as blood vessels, organs, and muscles, which would require other procedures. BEFORE THE PROCEDURE  Do not eat or drink anything after midnight on the night before the procedure or as directed by your health care provider.  Take medicines only as directed by your health care provider.  Your health care provider may have you stop taking certain medicines 5-7 days before the procedure.  You will be given an enema before the procedure. During an enema, a liquid is injected into your rectum to clear out waste.  You may have lab tests the day of your procedure.   Plan to have someone take you home after the procedure. PROCEDURE   You will be given medicine to help you relax (sedative) before the procedure. An IV tube will be inserted into one of your veins and used to give fluids and  medicine.  You will be given antibiotic medicine to reduce the risk of an infection.  You will be placed on your side for the procedure.  A probe with lubricated gel will be placed into your rectum, and images will be taken of your prostate and surrounding structures.  Numbing medicine will be injected into the prostate before the biopsy samples are taken.  A biopsy needle will then be inserted and guided to your prostate with the use of the ultrasound images.  Samples of prostate tissue will be taken, and the needle will then be removed.  The biopsy samples will be sent to a lab to be analyzed. Results are usually back in 2-3 days. AFTER THE PROCEDURE You may expect frequency of urination and/or urgency (a stronger desire to urinate) and perhaps even getting up at night more often. This will usually resolve or improve slowly over the healing period. You may see some blood in your urine over the first 2 weeks. Do not be alarmed, even if the urine was clear for a while. Get off your feet and drink lots of fluids until clearing occurs. If you start to pass clots or don't improve call us.  Diet:  You may return to your normal diet immediately.  Alcohol, spicy foods, foods high in acid and drinks with caffeine may cause irritation or frequency and should be used in moderation. To keep your urine flowing freely and avoid constipation, drink plenty of fluids during the day (8-10 glasses). Tip: Avoid cranberry juice because it is very acidic.  Activity:  Your physical activity doesn't need to be restricted. However, if  you are very active, you may see some blood in the urine. We suggest that you reduce your activity under the circumstances until the bleeding has stopped.  Bowels:  It is important to keep your bowels regular during the postoperative period. Straining with bowel movements can cause bleeding. A bowel movement every other day is reasonable. Use a mild laxative if needed, such as  milk of magnesia 2-3 tablespoons, or 2 Dulcolax tablets. Call if you continue to have problems. If you had been taking narcotics for pain, before, during or after your surgery, you may be constipated. Take a laxative if necessary.    Medication:  You should resume your pre-surgery medications unless told not to. In addition you may be given an antibiotic to prevent or treat infection. Antibiotics are not always necessary. All medication should be taken as prescribed until the bottles are finished unless you are having an unusual reaction to one of the drugs.       Post Anesthesia Home Care Instructions  Activity: Get plenty of rest for the remainder of the day. A responsible adult should stay with you for 24 hours following the procedure.  For the next 24 hours, DO NOT: -Drive a car -Paediatric nurse -Drink alcoholic beverages -Take any medication unless instructed by your physician -Make any legal decisions or sign important papers.  Meals: Start with liquid foods such as gelatin or soup. Progress to regular foods as tolerated. Avoid greasy, spicy, heavy foods. If nausea and/or vomiting occur, drink only clear liquids until the nausea and/or vomiting subsides. Call your physician if vomiting continues.  Special Instructions/Symptoms: Your throat may feel dry or sore from the anesthesia or the breathing tube placed in your throat during surgery. If this causes discomfort, gargle with warm salt water. The discomfort should disappear within 24 hours.

## 2014-05-18 NOTE — Anesthesia Procedure Notes (Addendum)
Procedure Name: LMA Insertion Date/Time: 05/18/2014 9:54 AM Performed by: Justice Rocher Pre-anesthesia Checklist: Patient identified, Emergency Drugs available, Suction available and Patient being monitored Patient Re-evaluated:Patient Re-evaluated prior to inductionOxygen Delivery Method: Circle System Utilized Preoxygenation: Pre-oxygenation with 100% oxygen Intubation Type: IV induction Ventilation: Mask ventilation without difficulty LMA: LMA inserted LMA Size: 4.0 Number of attempts: 1 Airway Equipment and Method: bite block Placement Confirmation: positive ETCO2 Tube secured with: Tape Dental Injury: Teeth and Oropharynx as per pre-operative assessment  Comments: LMA inserted # 4 - unable to seal. Changed to LMA # 5 and them # 4 LMA supreme per LB / Dr Jillyn Hidden BBS equal SaO2 98- 100 % unable to seated well, suctioned mask ventilates easily    Decision to intubate per Dr Jillyn Hidden    Procedure Name: Intubation Date/Time: 05/18/2014 9:58 AM Performed by: Justice Rocher Pre-anesthesia Checklist: Patient identified, Emergency Drugs available, Suction available and Patient being monitored Patient Re-evaluated:Patient Re-evaluated prior to inductionOxygen Delivery Method: Circle System Utilized Preoxygenation: Pre-oxygenation with 100% oxygen Intubation Type: IV induction Ventilation: Mask ventilation without difficulty Laryngoscope Size: Mac and 4 Tube type: Oral Tube size: 8.0 mm Number of attempts: 1 Airway Equipment and Method: stylet and oral airway Placement Confirmation: ETT inserted through vocal cords under direct vision,  positive ETCO2 and breath sounds checked- equal and bilateral Tube secured with: Tape Dental Injury: Teeth and Oropharynx as per pre-operative assessment

## 2014-05-18 NOTE — Transfer of Care (Signed)
Immediate Anesthesia Transfer of Care Note  Patient: [name]  Procedure(s) Performed: Procedure(s) (LRB): TRANSRECTAL ULTRASOUND OF THE PROSTATE/BIOPSY (N/A)  Patient Location: PACU  Anesthesia Type: General  Level of Consciousness: awake, sedated, patient cooperative and responds to stimulation  Airway & Oxygen Therapy: Patient Spontanous Breathing and Patient connected to face mask oxygen  Post-op Assessment: Report given to PACU RN, Post -op Vital signs reviewed and stable and Patient moving all extremities  Post vital signs: Reviewed and stable  Complications: No apparent anesthesia complications

## 2014-05-18 NOTE — Anesthesia Postprocedure Evaluation (Signed)
  Anesthesia Post-op Note  Patient: [name]  Procedure(s) Performed: Procedure(s) (LRB): TRANSRECTAL ULTRASOUND OF THE PROSTATE/BIOPSY (N/A)  Patient Location: PACU  Anesthesia Type: General  Level of Consciousness: awake and alert   Airway and Oxygen Therapy: Patient Spontanous Breathing  Post-op Pain: mild  Post-op Assessment: Post-op Vital signs reviewed, Patient's Cardiovascular Status Stable, Respiratory Function Stable, Patent Airway and No signs of Nausea or vomiting  Last Vitals:  Filed Vitals:   05/18/14 1100  BP: 108/71  Pulse: 79  Temp:   Resp: 13    Post-op Vital Signs: stable   Complications: No apparent anesthesia complications

## 2014-05-18 NOTE — Interval H&P Note (Signed)
History and Physical Interval Note:  05/18/2014 9:31 AM  [name]  has presented today for surgery, with the diagnosis of ELEVATED PROSTATIC SPECIFIC ANTIGEN (PSA)  The various methods of treatment have been discussed with the patient and family. After consideration of risks, benefits and other options for treatment, the patient has consented to  Procedure(s): TRANSRECTAL ULTRASOUND OF THE PROSTATE/BIOPSY (N/A) as a surgical intervention .  The patient's history has been reviewed, patient examined, no change in status, stable for surgery.  I have reviewed the patient's chart and labs.  Questions were answered to the patient's satisfaction.     Louis Meckel W

## 2014-05-21 ENCOUNTER — Encounter (HOSPITAL_BASED_OUTPATIENT_CLINIC_OR_DEPARTMENT_OTHER): Payer: Self-pay | Admitting: Urology

## 2014-05-23 ENCOUNTER — Ambulatory Visit (INDEPENDENT_AMBULATORY_CARE_PROVIDER_SITE_OTHER): Payer: Medicare Other | Admitting: Family Medicine

## 2014-05-23 ENCOUNTER — Encounter: Payer: Self-pay | Admitting: Family Medicine

## 2014-05-23 VITALS — BP 123/70 | HR 74 | Temp 98.1°F | Ht 73.0 in | Wt 175.0 lb

## 2014-05-23 DIAGNOSIS — M546 Pain in thoracic spine: Secondary | ICD-10-CM | POA: Diagnosis not present

## 2014-05-23 NOTE — Progress Notes (Signed)
   Subjective:    Patient ID: [name], male    DOB: Jan 14, 1946, 68 y.o.   MRN: [id number]  HPI Here for a sharp intermittent pain which starts in the left middle back and radiates around to the left chest area. This started several nights ago. No recent trauma. No SOB or cough. He can make the pain better or worse by changing the position of his body.    Review of Systems  Constitutional: Negative.   Respiratory: Negative.   Cardiovascular: Negative.   Musculoskeletal: Positive for back pain.       Objective:   Physical Exam  Constitutional: He appears well-developed and well-nourished.  Neck: No thyromegaly present.  Cardiovascular: Normal rate, regular rhythm, normal heart sounds and intact distal pulses.   Pulmonary/Chest: Effort normal and breath sounds normal.  Musculoskeletal:  Mildly tender to the left of the thoracic spine, full ROM  Lymphadenopathy:    He has no cervical adenopathy.          Assessment & Plan:  He has a tender muscle in the back which is probably the result of a pinched nerve. Rest, use heat, try Tylenol. Recheck prn

## 2014-05-23 NOTE — Progress Notes (Signed)
Pre visit review using our clinic review tool, if applicable. No additional management support is needed unless otherwise documented below in the visit note. 

## 2014-05-31 DIAGNOSIS — C61 Malignant neoplasm of prostate: Secondary | ICD-10-CM | POA: Diagnosis not present

## 2014-06-05 DIAGNOSIS — M9902 Segmental and somatic dysfunction of thoracic region: Secondary | ICD-10-CM | POA: Diagnosis not present

## 2014-06-05 DIAGNOSIS — M9901 Segmental and somatic dysfunction of cervical region: Secondary | ICD-10-CM | POA: Diagnosis not present

## 2014-06-05 DIAGNOSIS — M47814 Spondylosis without myelopathy or radiculopathy, thoracic region: Secondary | ICD-10-CM | POA: Diagnosis not present

## 2014-06-05 DIAGNOSIS — M542 Cervicalgia: Secondary | ICD-10-CM | POA: Diagnosis not present

## 2014-06-05 DIAGNOSIS — M9908 Segmental and somatic dysfunction of rib cage: Secondary | ICD-10-CM | POA: Diagnosis not present

## 2014-06-06 ENCOUNTER — Telehealth: Payer: Self-pay | Admitting: Family Medicine

## 2014-06-06 ENCOUNTER — Ambulatory Visit (INDEPENDENT_AMBULATORY_CARE_PROVIDER_SITE_OTHER)
Admission: RE | Admit: 2014-06-06 | Discharge: 2014-06-06 | Disposition: A | Discharge status: Home / Self Care | Payer: Medicare Other | Source: Ambulatory Visit | Attending: Family Medicine | Admitting: Family Medicine

## 2014-06-06 ENCOUNTER — Encounter: Payer: Self-pay | Admitting: Radiation Oncology

## 2014-06-06 DIAGNOSIS — M5134 Other intervertebral disc degeneration, thoracic region: Secondary | ICD-10-CM | POA: Diagnosis not present

## 2014-06-06 DIAGNOSIS — M546 Pain in thoracic spine: Secondary | ICD-10-CM | POA: Diagnosis not present

## 2014-06-06 NOTE — Telephone Encounter (Signed)
Pt states xray dept at [facility] will not release copies to him unless dr fry approves. Pt needs copies for his chiropractor

## 2014-06-06 NOTE — Telephone Encounter (Signed)
Done

## 2014-06-06 NOTE — Progress Notes (Signed)
GU Location of Tumor / Histology: prostate adenocarcinoma  If Prostate Cancer, Gleason Score is ( 4 + 3 ) and PSA is (15.10 on 04/13/14) 03/02/14 PSA 14.46  [name] presented 3 months ago with signs/symptoms of: left-sided testicular pain x 6 weeks, weak stream, post void dribbling  Biopsies of  prostate revealed:  05/18/14  Volume 42 gms Diagnosis 1. Prostate, needle biopsy(ies), left lateral base - BENIGN PROSTATIC TISSUE. - NO EVIDENCE OF MALIGNANCY. 2. Prostate, needle biopsy(ies), left lateral mid - PROSTATIC ADENOCARCINOMA, GLEASON SCORE 4 + 3 = 7, INVOLVING 60% OF THE TISSUE, TWO OF TWO CORES. - NO PERINEURAL INVASION IDENTIFIED. 3. Prostate, needle biopsy(ies), left lateral apex - PROSTATIC ADENOCARCINOMA, GLEASON SCORE 4 + 3 = 7, INVOLVING 80% OF THE TISSUE, ONE OF ONE CORE. - PERINEURAL INVASION PRESENT. 4. Prostate, needle biopsy(ies), left medial base x2 - BENIGN PROSTATIC TISSUE. - NO EVIDENCE OF MALIGNANCY. 5. Prostate, needle biopsy(ies), left medial mid x2 - PROSTATIC ADENOCARCINOMA, GLEASON SCORE 3 + 4 = 7, INVOLVING 20% OF THE TISSUE, ONE OF TWO CORES. - NO PERINEURAL INVASION IDENTIFIED. 6. Prostate, needle biopsy(ies), left medial apex - PROSTATIC ADENOCARCINOMA, GLEASON SCORE 3 + 4 = 7, INVOLVING 95% OF THE TISSUE, ONE OF ONE CORE. - PERINEURAL INVASION PRESENT.. 7. Prostate, needle biopsy(ies), right lateral base - PROSTATIC ADENOCARCINOMA, GLEASON SCORE 3 + 4 = 7, INVOLVING 70% OF THE TISSUE, ONE OF ONE CORE. - NO PERINEURAL INVASION IDENTIFIED. 8. Prostate, needle biopsy(ies), right lateral mid - PROSTATIC ADENOCARCINOMA, GLEASON SCORE 3 + 3 = 6, INVOLVING 60% OF THE TISSUE, ONE OF ONE CORE. - NO PERINEURAL INVASION IDENTIFIED. 9. Prostate, needle biopsy(ies), right lateral apex - FIBROMUSCULAR TISSUE. - NO EVIDENCE OF MALIGNANCY. 10. Prostate, needle biopsy(ies), right medial base - BENIGN PROSTATIC TISSUE, NO EVIDENCE OF MALIGNANCY. 11.  Prostate, needle biopsy(ies), right medial mid - BENIGN PROSTATIC TISSUE WITH GLANDULAR ATROPHY AND ASSOCIATED CHRONIC INFLAMMATION. NO EVIDENCE OF MALIGNANCY. 12. Prostate, needle biopsy(ies), right medial apex - PROSTATIC ADENOCARCINOMA, GLEASON SCORE 3 + 3 = 6, INVOLVING LESS THAN 10% OF THE TISSUE, ONE OF ONE CORE. - NO PERINEURAL INVASION IDENTIFIED.  Past/Anticipated interventions by urology, if any: biopsy  Past/Anticipated interventions by medical oncology, if any: no  Weight changes, if any: no  Bowel/Bladder complaints, if any:  IPSS 3, nocturia x 1  Nausea/Vomiting, if any: no  Pain issues, if any:  no  SAFETY ISSUES:  Prior radiation? no  Pacemaker/ICD? no  Possible current pregnancy? na   Is the patient on methotrexate? 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 No family history of prostate cancer.

## 2014-06-06 NOTE — Telephone Encounter (Signed)
Please call [facility] to authorize them to give copies to the patient

## 2014-06-07 ENCOUNTER — Ambulatory Visit
Admission: RE | Admit: 2014-06-07 | Discharge: 2014-06-07 | Disposition: A | Discharge status: Home / Self Care | Payer: Medicare Other | Source: Ambulatory Visit | Attending: Radiation Oncology | Admitting: Radiation Oncology

## 2014-06-07 ENCOUNTER — Encounter: Payer: Self-pay | Admitting: Radiation Oncology

## 2014-06-07 VITALS — BP 135/82 | HR 66 | Temp 97.8°F | Resp 20 | Ht 73.0 in | Wt 179.6 lb

## 2014-06-07 DIAGNOSIS — C61 Malignant neoplasm of prostate: Secondary | ICD-10-CM | POA: Diagnosis not present

## 2014-06-07 DIAGNOSIS — Z510 Encounter for antineoplastic radiation therapy: Secondary | ICD-10-CM | POA: Insufficient documentation

## 2014-06-07 HISTORY — DX: Malignant neoplasm of prostate: C61

## 2014-06-07 NOTE — Progress Notes (Signed)
Please see the Nurse Progress Note in the MD Initial Consult Encounter for this patient. 

## 2014-06-07 NOTE — Progress Notes (Signed)
[facility] NEW PATIENT EVALUATION  Name: [name] MRN: [id number]  Date:   06/07/2014           DOB: 1946/05/13  Status: outpatient   CC: Laurey Morale, MD  Ardis Hughs, MD Dr. Dutch Gray   REFERRING PHYSICIAN: Ardis Hughs, MD   DIAGNOSIS: Clinical stage "T2a" high-risk adenocarcinoma prostate   HISTORY OF PRESENT ILLNESS:  [name] is a 68 y.o. male who is seen today through the courtesy of Dr. Louis Meckel for discussion of possible radiation therapy in the management of his clinical stage "T2a" high-risk adenocarcinoma prostate. He saw Dr. Louis Meckel for left testicular pain felt to be related to biking . He had not had a PSA for 5 years and this was obtained on 03/02/2014 revealing a PSA of 14.5. A repeat PSA on 04/13/2014 was 15.1. He underwent ultrasound-guided biopsies on 05/18/2014 by Dr. Louis Meckel. His was found to have Gleason 7 (4+3) involving 60% of tissue and 2 of 2 cores from the left lateral mid gland, and 80% of one core from the left lateral apex. He had Gleason 7 (3+4) involving 20% of tissue in one of 2 cores from the left medial mid gland, 95% of tissue in one of one core from the left medial apex, 70% of one of one core from the right lateral base. He Gleason 6 (3+3) involving 60% of tissue in one of one core from the right lateral mid gland and less than 10% of one core from the right medial apex. His gland volume was approximately 42 cc. He is doing well from a GU and GI standpoint. His I PSS score is 3. He is potent. He has not yet had a CT scan or bone scan.  PREVIOUS RADIATION THERAPY: No   PAST MEDICAL HISTORY:  has a past medical history of History of basal cell carcinoma excision; Elevated PSA; White coat hypertension; History of epididymitis; Arthritis; Wears contact lenses; and Prostate cancer (05/18/14).     PAST SURGICAL HISTORY:  Past Surgical History  Procedure Laterality Date  . Colonoscopy  02-19-2004  .  Prostate biopsy N/A 05/18/2014    Procedure: TRANSRECTAL ULTRASOUND OF THE PROSTATE/BIOPSY;  Surgeon: Ardis Hughs, MD;  Location: [facility];  Service: Urology;  Laterality: N/A;     FAMILY HISTORY: family history includes Cancer in his sister; Dementia in his maternal aunt, maternal uncle, and mother; Stroke in his father. Father died of a stroke at age 58. His mother died from complications of Alzheimer's disease at 27. No family history of prostate cancer.   SOCIAL HISTORY:  reports that he has never smoked. He has never used smokeless tobacco. He reports that he drinks about 8.4 ounces of alcohol per week. He reports that he does not use illicit drugs. 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   ALLERGIES: Lisinopril; Penicillins; Doxycycline; and Levaquin   MEDICATIONS:  Current Outpatient Prescriptions  Medication Sig Dispense Refill  . acetaminophen-codeine (TYLENOL #3) 300-30 MG per tablet Take 1 tablet by mouth every 4 (four) hours as needed.  20 tablet  0  . Acetylcarnitine HCl (ACETYL L-CARNITINE PO) Take by mouth daily.      . Cholecalciferol (VITAMIN D3) 1000 UNITS CAPS Take by mouth daily.      . Coenzyme Q10 (COQ-10) 100 MG CAPS Take by mouth daily.      . diazepam (VALIUM) 5 MG tablet Take 1 tablet (5 mg total)  by mouth at bedtime as needed for anxiety or sedation (take at night to help with insomnia).  20 tablet  0  . Magnesium Hydroxide (MAGNESIA PO) Take by mouth daily.      . Misc Natural Products (PROSTATE SUPPORT PO) Take by mouth daily.      . Multiple Vitamins-Minerals (MULTIVITAMIN WITH MINERALS) tablet Take 1 tablet by mouth daily.      . vitamin B-12 (CYANOCOBALAMIN) 1000 MCG tablet Take 1,000 mcg by mouth daily.      . vitamin C (ASCORBIC ACID) 500 MG tablet Take 500 mg by mouth daily.      . vitamin E 400 UNIT capsule Take 400 Units by mouth daily.       No current facility-administered medications for this  encounter.     REVIEW OF SYSTEMS:  Pertinent items are noted in HPI.    PHYSICAL EXAM:  height is 6\' 1"  (1.854 m) and weight is 179 lb 9.6 oz (81.466 kg). His oral temperature is 97.8 F (36.6 C). His blood pressure is 135/82 and his pulse is 66. His respiration is 20.   Alert and oriented 68 year old white male appearing younger than his stated age. Head and neck examination: Grossly unremarkable. Nodes: Without palpable cervical or supraclavicular lymphadenopathy. Chest: Lungs clear. Abdomen: Without masses organomegaly. Genitalia: Unremarkable to inspection. Rectal: The prostate gland is normal size and there is a raised indurated nodule along his left apex. I do not appreciate any palpable extraprostatic extension or involvement of his seminal vesicles. Extremities: Without edema.   LABORATORY DATA:  Lab Results  Component Value Date   WBC 6.4 05/02/2013   HGB 15.0 05/18/2014   HCT 44.0 05/18/2014   MCV 93.0 05/02/2013   PLT 373.0 05/02/2013   Lab Results  Component Value Date   NA 140 05/18/2014   K 3.6* 05/18/2014   CL 105 05/18/2014   CO2 30 05/02/2013   Lab Results  Component Value Date   ALT 16 05/19/2013   AST 20 05/19/2013   ALKPHOS 59 05/19/2013   BILITOT 0.9 05/19/2013   PSA 15.1 from 04/13/2014   IMPRESSION: Clinical stage T2a high-risk adenocarcinoma prostate. Looking at his pathology he really has clinical stage T2c disease. I explained to the patient and his wife that his prognosis is related to his stage, Gleason score, and PSA level. His stage is of intermediate favorability, and his Gleason score 7 and PSA of over 10 are both of intermediate favorability. Together, this places him in the "high-risk" group. Other prognostic factors include PSA doubling time and disease volume. He does have high disease volume. Management options include robotic surgery with the possible need for adjuvant radiation therapy versus radiation therapy. Radiation therapy options include 5 weeks of  external beam followed by seed implantation or 8 weeks of external beam/IMRT. With both radiation therapy options, I do recommend 2 years of androgen deprivation therapy. We discussed the potential acute and late toxicities of radiation therapy and also androgen deprivation therapy. He understands that  with surgery he could avoid the need for androgen deprivation therapy. He will see Dr. Alinda Money this afternoon for discussion of robotic surgery.   PLAN: As discussed above. He can contact me if he wants to pursue radiation therapy.   I spent 60 minutes minutes face to face with the patient and more than 50% of that time was spent in counseling and/or coordination of care.

## 2014-06-08 DIAGNOSIS — M47814 Spondylosis without myelopathy or radiculopathy, thoracic region: Secondary | ICD-10-CM | POA: Diagnosis not present

## 2014-06-08 DIAGNOSIS — M542 Cervicalgia: Secondary | ICD-10-CM | POA: Diagnosis not present

## 2014-06-08 DIAGNOSIS — M9908 Segmental and somatic dysfunction of rib cage: Secondary | ICD-10-CM | POA: Diagnosis not present

## 2014-06-08 DIAGNOSIS — M9902 Segmental and somatic dysfunction of thoracic region: Secondary | ICD-10-CM | POA: Diagnosis not present

## 2014-06-08 DIAGNOSIS — M9901 Segmental and somatic dysfunction of cervical region: Secondary | ICD-10-CM | POA: Diagnosis not present

## 2014-06-08 NOTE — Telephone Encounter (Signed)
I spoke with Xray and pt just needs to sign release form and then he can get a copy, I spoke with pt and gave this information.

## 2014-06-09 ENCOUNTER — Encounter: Payer: Self-pay | Admitting: Family Medicine

## 2014-06-09 DIAGNOSIS — M546 Pain in thoracic spine: Secondary | ICD-10-CM

## 2014-06-11 NOTE — Telephone Encounter (Signed)
The other option for him is I can refer him to orthopedics. Ask him if he has a preference on whom to see

## 2014-06-12 ENCOUNTER — Other Ambulatory Visit (HOSPITAL_COMMUNITY): Payer: Self-pay | Admitting: Urology

## 2014-06-12 DIAGNOSIS — C61 Malignant neoplasm of prostate: Secondary | ICD-10-CM

## 2014-06-12 NOTE — Telephone Encounter (Signed)
Referral was done  

## 2014-06-13 NOTE — Telephone Encounter (Signed)
The referral was done  

## 2014-06-16 DIAGNOSIS — Z23 Encounter for immunization: Secondary | ICD-10-CM | POA: Diagnosis not present

## 2014-06-22 ENCOUNTER — Encounter (HOSPITAL_COMMUNITY)
Admission: RE | Admit: 2014-06-22 | Discharge: 2014-06-22 | Disposition: A | Discharge status: Home / Self Care | Payer: Medicare Other | Source: Ambulatory Visit | Attending: Diagnostic Radiology | Admitting: Diagnostic Radiology

## 2014-06-22 ENCOUNTER — Encounter (HOSPITAL_COMMUNITY)
Admission: RE | Admit: 2014-06-22 | Discharge: 2014-06-22 | Disposition: A | Discharge status: Home / Self Care | Payer: Medicare Other | Source: Ambulatory Visit | Attending: Urology | Admitting: Urology

## 2014-06-22 DIAGNOSIS — R972 Elevated prostate specific antigen [PSA]: Secondary | ICD-10-CM | POA: Diagnosis not present

## 2014-06-22 DIAGNOSIS — C61 Malignant neoplasm of prostate: Secondary | ICD-10-CM

## 2014-06-22 MED ORDER — TECHNETIUM TC 99M MEDRONATE IV KIT
27.0000 | PACK | Freq: Once | INTRAVENOUS | Status: AC | PRN
  Administered 2014-06-22: 27 via INTRAVENOUS

## 2014-06-27 ENCOUNTER — Other Ambulatory Visit: Payer: Self-pay | Admitting: Urology

## 2014-06-27 ENCOUNTER — Other Ambulatory Visit (HOSPITAL_COMMUNITY): Payer: Self-pay | Admitting: Urology

## 2014-06-27 DIAGNOSIS — C61 Malignant neoplasm of prostate: Secondary | ICD-10-CM

## 2014-06-29 DIAGNOSIS — M546 Pain in thoracic spine: Secondary | ICD-10-CM | POA: Diagnosis not present

## 2014-07-04 DIAGNOSIS — M546 Pain in thoracic spine: Secondary | ICD-10-CM | POA: Diagnosis not present

## 2014-07-06 DIAGNOSIS — C61 Malignant neoplasm of prostate: Secondary | ICD-10-CM | POA: Diagnosis not present

## 2014-07-06 DIAGNOSIS — M546 Pain in thoracic spine: Secondary | ICD-10-CM | POA: Diagnosis not present

## 2014-07-06 DIAGNOSIS — M6281 Muscle weakness (generalized): Secondary | ICD-10-CM | POA: Diagnosis not present

## 2014-07-16 DIAGNOSIS — N508 Other specified disorders of male genital organs: Secondary | ICD-10-CM | POA: Diagnosis not present

## 2014-07-16 DIAGNOSIS — M546 Pain in thoracic spine: Secondary | ICD-10-CM | POA: Diagnosis not present

## 2014-07-16 DIAGNOSIS — M6281 Muscle weakness (generalized): Secondary | ICD-10-CM | POA: Diagnosis not present

## 2014-07-16 DIAGNOSIS — C61 Malignant neoplasm of prostate: Secondary | ICD-10-CM | POA: Diagnosis not present

## 2014-07-18 DIAGNOSIS — M546 Pain in thoracic spine: Secondary | ICD-10-CM | POA: Diagnosis not present

## 2014-07-23 DIAGNOSIS — N390 Urinary tract infection, site not specified: Secondary | ICD-10-CM | POA: Diagnosis not present

## 2014-07-25 DIAGNOSIS — M546 Pain in thoracic spine: Secondary | ICD-10-CM | POA: Diagnosis not present

## 2014-07-26 ENCOUNTER — Ambulatory Visit (INDEPENDENT_AMBULATORY_CARE_PROVIDER_SITE_OTHER): Payer: Medicare Other | Admitting: Family Medicine

## 2014-07-26 DIAGNOSIS — Z23 Encounter for immunization: Secondary | ICD-10-CM

## 2014-07-26 NOTE — Patient Instructions (Addendum)
[name]  07/26/2014   Your procedure is scheduled on:  08/02/2014     Come thru the [place] Entrance.   Follow the Signs to [place] at   0900     am  Call this number if you have problems the morning of surgery: [phone]   Remember:   Do not eat food or drink liquids after midnight.   Take these medicines the morning of surgery with A SIP OF WATER: none    Do not wear jewelry,   Do not wear lotions, powders, or perfumes. deodorant.  . Men may shave face and neck.  Do not bring valuables to the hospital.  Contacts, dentures or bridgework may not be worn into surgery.  Leave suitcase in the car. After surgery it may be brought to your room.  For patients admitted to the hospital, checkout time is 11:00 AM the day of  discharge.          Please read over the following fact sheets that you were given:  coughing and deep breathing exercises, leg exercises            [place] - Preparing for Surgery Before surgery, you can play an important role.  Because skin is not sterile, your skin needs to be as free of germs as possible.  You can reduce the number of germs on your skin by washing with CHG (chlorahexidine gluconate) soap before surgery.  CHG is an antiseptic cleaner which kills germs and bonds with the skin to continue killing germs even after washing. Please DO NOT use if you have an allergy to CHG or antibacterial soaps.  If your skin becomes reddened/irritated stop using the CHG and inform your nurse when you arrive at Short Stay. Do not shave (including legs and underarms) for at least 48 hours prior to the first CHG shower.  You may shave your face/neck. Please follow these instructions carefully:  1.  Shower with CHG Soap the night before surgery and the  morning of Surgery.  2.  If you choose to wash your hair, wash your hair first as usual with your  normal  shampoo.  3.  After you shampoo, rinse your hair and body thoroughly to remove the  shampoo.                            4.  Use CHG as you would any other liquid soap.  You can apply chg directly  to the skin and wash                       Gently with a scrungie or clean washcloth.  5.  Apply the CHG Soap to your body ONLY FROM THE NECK DOWN.   Do not use on face/ open                           Wound or open sores. Avoid contact with eyes, ears mouth and genitals (private parts).                       Wash face,  Genitals (private parts) with your normal soap.             6.  Wash thoroughly, paying special attention to the area where your surgery  will be performed.  7.  Thoroughly rinse your body with  warm water from the neck down.  8.  DO NOT shower/wash with your normal soap after using and rinsing off  the CHG Soap.                9.  Pat yourself dry with a clean towel.            10.  Wear clean pajamas.            11.  Place clean sheets on your bed the night of your first shower and do not  sleep with pets. Day of Surgery : Do not apply any lotions/deodorants the morning of surgery.  Please wear clean clothes to the hospital/surgery center.  FAILURE TO FOLLOW THESE INSTRUCTIONS MAY RESULT IN THE CANCELLATION OF YOUR SURGERY PATIENT SIGNATURE_________________________________  NURSE SIGNATURE__________________________________  ________________________________________________________________________  WHAT IS A BLOOD TRANSFUSION? Blood Transfusion Information  A transfusion is the replacement of blood or some of its parts. Blood is made up of multiple cells which provide different functions.  Red blood cells carry oxygen and are used for blood loss replacement.  White blood cells fight against infection.  Platelets control bleeding.  Plasma helps clot blood.  Other blood products are available for specialized needs, such as hemophilia or other clotting disorders. BEFORE THE TRANSFUSION  Who gives blood for transfusions?   Healthy volunteers who are fully evaluated to  make sure their blood is safe. This is blood bank blood. Transfusion therapy is the safest it has ever been in the practice of medicine. Before blood is taken from a donor, a complete history is taken to make sure that person has no history of diseases nor engages in risky social behavior (examples are intravenous drug use or sexual activity with multiple partners). The donor's travel history is screened to minimize risk of transmitting infections, such as malaria. The donated blood is tested for signs of infectious diseases, such as HIV and hepatitis. The blood is then tested to be sure it is compatible with you in order to minimize the chance of a transfusion reaction. If you or a relative donates blood, this is often done in anticipation of surgery and is not appropriate for emergency situations. It takes many days to process the donated blood. RISKS AND COMPLICATIONS Although transfusion therapy is very safe and saves many lives, the main dangers of transfusion include:  1. Getting an infectious disease. 2. Developing a transfusion reaction. This is an allergic reaction to something in the blood you were given. Every precaution is taken to prevent this. The decision to have a blood transfusion has been considered carefully by your caregiver before blood is given. Blood is not given unless the benefits outweigh the risks. AFTER THE TRANSFUSION  Right after receiving a blood transfusion, you will usually feel much better and more energetic. This is especially true if your red blood cells have gotten low (anemic). The transfusion raises the level of the red blood cells which carry oxygen, and this usually causes an energy increase.  The nurse administering the transfusion will monitor you carefully for complications. HOME CARE INSTRUCTIONS  No special instructions are needed after a transfusion. You may find your energy is better. Speak with your caregiver about any limitations on activity for  underlying diseases you may have. SEEK MEDICAL CARE IF:   Your condition is not improving after your transfusion.  You develop redness or irritation at the intravenous (IV) site. SEEK IMMEDIATE MEDICAL CARE IF:  Any of the following symptoms occur over  the next 12 hours:  Shaking chills.  You have a temperature by mouth above 102 F (38.9 C), not controlled by medicine.  Chest, back, or muscle pain.  People around you feel you are not acting correctly or are confused.  Shortness of breath or difficulty breathing.  Dizziness and fainting.  You get a rash or develop hives.  You have a decrease in urine output.  Your urine turns a dark color or changes to pink, red, or brown. Any of the following symptoms occur over the next 10 days:  You have a temperature by mouth above 102 F (38.9 C), not controlled by medicine.  Shortness of breath.  Weakness after normal activity.  The white part of the eye turns yellow (jaundice).  You have a decrease in the amount of urine or are urinating less often.  Your urine turns a dark color or changes to pink, red, or brown. Document Released: 08/07/2000 Document Revised: 11/02/2011 Document Reviewed: 03/26/2008 ExitCare Patient Information 2014 [place].  _______________________________________________________________________  Incentive Spirometer  An incentive spirometer is a tool that can help keep your lungs clear and active. This tool measures how well you are filling your lungs with each breath. Taking long deep breaths may help reverse or decrease the chance of developing breathing (pulmonary) problems (especially infection) following:  A long period of time when you are unable to move or be active. BEFORE THE PROCEDURE   If the spirometer includes an indicator to show your best effort, your nurse or respiratory therapist will set it to a desired goal.  If possible, sit up straight or lean slightly forward. Try not to  slouch.  Hold the incentive spirometer in an upright position. INSTRUCTIONS FOR USE  3. Sit on the edge of your bed if possible, or sit up as far as you can in bed or on a chair. 4. Hold the incentive spirometer in an upright position. 5. Breathe out normally. 6. Place the mouthpiece in your mouth and seal your lips tightly around it. 7. Breathe in slowly and as deeply as possible, raising the piston or the ball toward the top of the column. 8. Hold your breath for 3-5 seconds or for as long as possible. Allow the piston or ball to fall to the bottom of the column. 9. Remove the mouthpiece from your mouth and breathe out normally. 10. Rest for a few seconds and repeat Steps 1 through 7 at least 10 times every 1-2 hours when you are awake. Take your time and take a few normal breaths between deep breaths. 11. The spirometer may include an indicator to show your best effort. Use the indicator as a goal to work toward during each repetition. 12. After each set of 10 deep breaths, practice coughing to be sure your lungs are clear. If you have an incision (the cut made at the time of surgery), support your incision when coughing by placing a pillow or rolled up towels firmly against it. Once you are able to get out of bed, walk around indoors and cough well. You may stop using the incentive spirometer when instructed by your caregiver.  RISKS AND COMPLICATIONS  Take your time so you do not get dizzy or light-headed.  If you are in pain, you may need to take or ask for pain medication before doing incentive spirometry. It is harder to take a deep breath if you are having pain. AFTER USE  Rest and breathe slowly and easily.  It can be  helpful to keep track of a log of your progress. Your caregiver can provide you with a simple table to help with this. If you are using the spirometer at home, follow these instructions: [place] IF:   You are having difficultly using the  spirometer.  You have trouble using the spirometer as often as instructed.  Your pain medication is not giving enough relief while using the spirometer.  You develop fever of 100.5 F (38.1 C) or higher. SEEK IMMEDIATE MEDICAL CARE IF:   You cough up bloody sputum that had not been present before.  You develop fever of 102 F (38.9 C) or greater.  You develop worsening pain at or near the incision site. MAKE SURE YOU:   Understand these instructions.  Will watch your condition.  Will get help right away if you are not doing well or get worse. Document Released: 12/21/2006 Document Revised: 11/02/2011 Document Reviewed: 02/21/2007 [facility] Patient Information 2014 [place], [place].   ________________________________________________________________________

## 2014-07-27 ENCOUNTER — Ambulatory Visit (HOSPITAL_COMMUNITY)
Admission: RE | Admit: 2014-07-27 | Discharge: 2014-07-27 | Disposition: A | Discharge status: Home / Self Care | Payer: Medicare Other | Source: Ambulatory Visit | Attending: Urology | Admitting: Urology

## 2014-07-27 DIAGNOSIS — N400 Enlarged prostate without lower urinary tract symptoms: Secondary | ICD-10-CM | POA: Diagnosis not present

## 2014-07-27 DIAGNOSIS — Z01818 Encounter for other preprocedural examination: Secondary | ICD-10-CM | POA: Diagnosis not present

## 2014-07-27 DIAGNOSIS — C61 Malignant neoplasm of prostate: Secondary | ICD-10-CM

## 2014-07-27 LAB — POCT I-STAT CREATININE: Creatinine, Ser: 0.9 mg/dL (ref 0.50–1.35)

## 2014-07-27 MED ORDER — GADOBENATE DIMEGLUMINE 529 MG/ML IV SOLN
17.0000 mL | Freq: Once | INTRAVENOUS | Status: AC | PRN
  Administered 2014-07-27: 17 mL via INTRAVENOUS

## 2014-07-30 ENCOUNTER — Encounter (HOSPITAL_COMMUNITY)
Admission: RE | Admit: 2014-07-30 | Discharge: 2014-07-30 | Disposition: A | Discharge status: Home / Self Care | Payer: Medicare Other | Source: Ambulatory Visit | Attending: Urology | Admitting: Urology

## 2014-07-30 ENCOUNTER — Ambulatory Visit (HOSPITAL_COMMUNITY)
Admission: RE | Admit: 2014-07-30 | Discharge: 2014-07-30 | Disposition: A | Discharge status: Home / Self Care | Payer: Medicare Other | Source: Ambulatory Visit | Attending: Urology | Admitting: Urology

## 2014-07-30 ENCOUNTER — Encounter (HOSPITAL_COMMUNITY): Payer: Self-pay

## 2014-07-30 DIAGNOSIS — Z01818 Encounter for other preprocedural examination: Secondary | ICD-10-CM

## 2014-07-30 DIAGNOSIS — C61 Malignant neoplasm of prostate: Secondary | ICD-10-CM | POA: Insufficient documentation

## 2014-07-30 DIAGNOSIS — M546 Pain in thoracic spine: Secondary | ICD-10-CM | POA: Diagnosis not present

## 2014-07-30 HISTORY — DX: Unspecified asthma, uncomplicated: J45.909

## 2014-07-30 HISTORY — DX: Pneumonia, unspecified organism: J18.9

## 2014-07-30 LAB — ABO/RH: ABO/RH(D): A POS

## 2014-07-30 LAB — CBC
HEMATOCRIT: 40.1 % (ref 39.0–52.0)
HEMOGLOBIN: 13.5 g/dL (ref 13.0–17.0)
MCH: 31.1 pg (ref 26.0–34.0)
MCHC: 33.7 g/dL (ref 30.0–36.0)
MCV: 92.4 fL (ref 78.0–100.0)
Platelets: 351 10*3/uL (ref 150–400)
RBC: 4.34 MIL/uL (ref 4.22–5.81)
RDW: 11.4 % — AB (ref 11.5–15.5)
WBC: 6.1 10*3/uL (ref 4.0–10.5)

## 2014-07-30 LAB — BASIC METABOLIC PANEL
Anion gap: 12 (ref 5–15)
BUN: 15 mg/dL (ref 6–23)
CHLORIDE: 100 meq/L (ref 96–112)
CO2: 27 meq/L (ref 19–32)
CREATININE: 0.86 mg/dL (ref 0.50–1.35)
Calcium: 9.8 mg/dL (ref 8.4–10.5)
GFR calc non Af Amer: 88 mL/min — ABNORMAL LOW (ref >90)
Glucose, Bld: 92 mg/dL (ref 70–99)
Potassium: 4.3 mEq/L (ref 3.7–5.3)
SODIUM: 139 meq/L (ref 137–147)

## 2014-07-30 NOTE — Progress Notes (Signed)
10/01/2013 LOV- Dr Burt Ek on chart .  Dr Doristine Johns- 08/18/2013 LOV note on chart Dr Doristine Johns- Office visit note- 08/16/2013 on chart

## 2014-07-31 NOTE — Progress Notes (Signed)
Per Anesthesia per Kieth Brightly, phlebotomist, anesthesia will look at EKG day of surgery since unable to obtain confirmed EKG report from EKG done at time of preop on 07/30/2014.

## 2014-08-01 NOTE — H&P (Signed)
  Chief Complaint Prostate Cancer    History of Present Illness [name] is a 68 year old gentleman who was evaluated by Dr. Louis Meckel initially in July 2015 for scrotal pain. He elected to undergo PSA screening at that time and his initial PSA was 14.46. His last PSA had been 2.8 when checked in 2005. He then underwent PHI testing which was elevated at 55.69. This prompted a prostate needle biopsy on 05/18/14 demonstrating Gleason 4+3=7 adenocarcinoma with 7 out of 14 biopsy cores positive for malignancy. He has no family history of prostate cancer. He has been well counseled by Dr. Louis Meckel about his options for treatment.  TNM stage: cT2b Nx Mx (L mid/apical nodularity) PSA: 14.46 Gleason score: 4+3=7 Biopsy (05/18/14): 7/14 cores   Left: L lateral apex (80%, 4+3=7, PNI), L apex (95%, 3+4=7, PNI), L lateral mid (80%, 4+3=7), L mid (20%, 3+4=7)   Right: R apex (10%, 3+3=6), R lateral mid (60%, 3+3=6), R lateral base (70%, 3+4=7) Prostate volume: 42 cc  Nomogram OC disease: 9% EPE: 88% SVI:23% LNI: 29% PFS (surgery): 34% at 5 years, 22% at 10 years  Urinary function: IPSS is 1.  Erectile function: SHIM score is 22.   Past Medical History Problems  1. History of arthritis (Z87.39)  Surgical History Problems  1. History of No Surgical Problems  Allergies Medication  1. Doxycycline Hyclate CAPS 2. levofloxacin 3. Lisinopril TABS 4. Penicillins  Rash and Hives for all meds.   Family History Problems  1. No pertinent family history : Mother  Social History Problems    Alcohol use (F10.99)   Married   Never a smoker  Avid cyclist   Review of Systems Constitutional, skin, eye, otolaryngeal, hematologic/lymphatic, cardiovascular, pulmonary, endocrine, musculoskeletal, gastrointestinal, neurological and psychiatric system(s) were reviewed and pertinent findings if present are noted.    Physical Exam Constitutional: Well nourished and well developed . No acute  distress.  ENT:. The ears and nose are normal in appearance.  Neck: The appearance of the neck is normal and no neck mass is present.  Cardiovascular: Heart rate and rhythm are normal . No peripheral edema.  Abdomen: The abdomen is soft and nontender. No masses are palpated. No CVA tenderness. No hernias are palpable. No hepatosplenomegaly noted.    Assessment Assessed  1. Prostate cancer (C61)   Discussion/Summary Prostate Cancer: [name] has elected to proceed with surgical therapy and will undergo a robotic assisted laparoscopic radical prostatectomy and BPLND.

## 2014-08-02 ENCOUNTER — Inpatient Hospital Stay (HOSPITAL_COMMUNITY): Payer: Medicare Other | Admitting: Anesthesiology

## 2014-08-02 ENCOUNTER — Inpatient Hospital Stay (HOSPITAL_COMMUNITY)
Admission: RE | Admit: 2014-08-02 | Discharge: 2014-08-03 | DRG: 708 | Disposition: A | Discharge status: Home / Self Care | Payer: Medicare Other | Source: Ambulatory Visit | Attending: Urology | Admitting: Urology

## 2014-08-02 ENCOUNTER — Encounter (HOSPITAL_COMMUNITY): Payer: Self-pay | Admitting: *Deleted

## 2014-08-02 ENCOUNTER — Encounter (HOSPITAL_COMMUNITY)
Admission: RE | Disposition: A | Discharge status: Home / Self Care | Payer: Self-pay | Source: Ambulatory Visit | Attending: Urology

## 2014-08-02 DIAGNOSIS — C61 Malignant neoplasm of prostate: Secondary | ICD-10-CM | POA: Diagnosis present

## 2014-08-02 DIAGNOSIS — F1010 Alcohol abuse, uncomplicated: Secondary | ICD-10-CM | POA: Diagnosis present

## 2014-08-02 DIAGNOSIS — M1990 Unspecified osteoarthritis, unspecified site: Secondary | ICD-10-CM | POA: Diagnosis present

## 2014-08-02 HISTORY — PX: LYMPHADENECTOMY: SHX5960

## 2014-08-02 HISTORY — PX: ROBOT ASSISTED LAPAROSCOPIC RADICAL PROSTATECTOMY: SHX5141

## 2014-08-02 LAB — HEMOGLOBIN AND HEMATOCRIT, BLOOD
HEMATOCRIT: 36.1 % — AB (ref 39.0–52.0)
Hemoglobin: 12 g/dL — ABNORMAL LOW (ref 13.0–17.0)

## 2014-08-02 LAB — TYPE AND SCREEN
ABO/RH(D): A POS
Antibody Screen: NEGATIVE

## 2014-08-02 SURGERY — ROBOTIC ASSISTED LAPAROSCOPIC RADICAL PROSTATECTOMY LEVEL 2
Anesthesia: General | Site: Abdomen

## 2014-08-02 MED ORDER — SODIUM CHLORIDE 0.9 % IR SOLN
Status: DC | PRN
  Administered 2014-08-02: 1000 mL

## 2014-08-02 MED ORDER — MORPHINE SULFATE 2 MG/ML IJ SOLN
2.0000 mg | INTRAMUSCULAR | Status: DC | PRN
  Administered 2014-08-02: 2 mg via INTRAVENOUS
  Administered 2014-08-02 (×2): 4 mg via INTRAVENOUS
  Administered 2014-08-03: 2 mg via INTRAVENOUS
  Filled 2014-08-02: qty 2
  Filled 2014-08-02: qty 1
  Filled 2014-08-02: qty 2
  Filled 2014-08-02: qty 1

## 2014-08-02 MED ORDER — DIPHENHYDRAMINE HCL 12.5 MG/5ML PO ELIX: 12.5000 mg | ORAL_SOLUTION | Freq: Four times a day (QID) | ORAL | Status: DC | PRN

## 2014-08-02 MED ORDER — STERILE WATER FOR IRRIGATION IR SOLN
Status: DC | PRN
  Administered 2014-08-02: 3000 mL

## 2014-08-02 MED ORDER — SULFAMETHOXAZOLE-TRIMETHOPRIM 800-160 MG PO TABS: 1.0000 | ORAL_TABLET | Freq: Two times a day (BID) | ORAL | Status: DC

## 2014-08-02 MED ORDER — BUPIVACAINE-EPINEPHRINE (PF) 0.25% -1:200000 IJ SOLN
INTRAMUSCULAR | Status: AC
  Filled 2014-08-02: qty 30

## 2014-08-02 MED ORDER — FENTANYL CITRATE 0.05 MG/ML IJ SOLN
INTRAMUSCULAR | Status: AC
  Filled 2014-08-02: qty 5

## 2014-08-02 MED ORDER — ONDANSETRON HCL 4 MG/2ML IJ SOLN
INTRAMUSCULAR | Status: AC
Start: 2014-08-02 — End: 2014-08-02
  Filled 2014-08-02: qty 2

## 2014-08-02 MED ORDER — MIDAZOLAM HCL 2 MG/2ML IJ SOLN
INTRAMUSCULAR | Status: AC
  Filled 2014-08-02: qty 2

## 2014-08-02 MED ORDER — SODIUM CHLORIDE 0.9 % IV BOLUS (SEPSIS)
1000.0000 mL | Freq: Once | INTRAVENOUS | Status: AC
  Administered 2014-08-02: 1000 mL via INTRAVENOUS

## 2014-08-02 MED ORDER — HYDROMORPHONE HCL 1 MG/ML IJ SOLN
INTRAMUSCULAR | Status: AC
  Filled 2014-08-02: qty 1

## 2014-08-02 MED ORDER — CEFAZOLIN SODIUM-DEXTROSE 2-3 GM-% IV SOLR
2.0000 g | INTRAVENOUS | Status: AC
  Administered 2014-08-02: 2 g via INTRAVENOUS

## 2014-08-02 MED ORDER — GLYCOPYRROLATE 0.2 MG/ML IJ SOLN
INTRAMUSCULAR | Status: DC | PRN
  Administered 2014-08-02: .6 mg via INTRAVENOUS

## 2014-08-02 MED ORDER — CEFAZOLIN SODIUM 1-5 GM-% IV SOLN
1.0000 g | Freq: Three times a day (TID) | INTRAVENOUS | Status: AC
  Administered 2014-08-02 – 2014-08-03 (×2): 1 g via INTRAVENOUS
  Filled 2014-08-02 (×2): qty 50

## 2014-08-02 MED ORDER — LIDOCAINE HCL (CARDIAC) 20 MG/ML IV SOLN
INTRAVENOUS | Status: DC | PRN
  Administered 2014-08-02: 50 mg via INTRAVENOUS

## 2014-08-02 MED ORDER — BUPIVACAINE-EPINEPHRINE 0.25% -1:200000 IJ SOLN
INTRAMUSCULAR | Status: DC | PRN
  Administered 2014-08-02: 30 mL

## 2014-08-02 MED ORDER — OXYCODONE HCL 5 MG PO TABS: 5.0000 mg | ORAL_TABLET | Freq: Once | ORAL | Status: DC | PRN

## 2014-08-02 MED ORDER — PROPOFOL 10 MG/ML IV BOLUS
INTRAVENOUS | Status: AC
  Filled 2014-08-02: qty 20

## 2014-08-02 MED ORDER — DEXAMETHASONE SODIUM PHOSPHATE 10 MG/ML IJ SOLN
INTRAMUSCULAR | Status: DC | PRN
  Administered 2014-08-02: 10 mg via INTRAVENOUS

## 2014-08-02 MED ORDER — ROCURONIUM BROMIDE 100 MG/10ML IV SOLN
INTRAVENOUS | Status: DC | PRN
  Administered 2014-08-02 (×3): 10 mg via INTRAVENOUS
  Administered 2014-08-02: 50 mg via INTRAVENOUS
  Administered 2014-08-02 (×3): 20 mg via INTRAVENOUS

## 2014-08-02 MED ORDER — DOCUSATE SODIUM 100 MG PO CAPS
100.0000 mg | ORAL_CAPSULE | Freq: Two times a day (BID) | ORAL | Status: DC
  Administered 2014-08-02 – 2014-08-03 (×2): 100 mg via ORAL
  Filled 2014-08-02 (×2): qty 1

## 2014-08-02 MED ORDER — NEOSTIGMINE METHYLSULFATE 10 MG/10ML IV SOLN
INTRAVENOUS | Status: AC
  Filled 2014-08-02: qty 1

## 2014-08-02 MED ORDER — KCL IN DEXTROSE-NACL 20-5-0.45 MEQ/L-%-% IV SOLN
INTRAVENOUS | Status: AC
  Filled 2014-08-02: qty 1000

## 2014-08-02 MED ORDER — HYDROMORPHONE HCL 2 MG/ML IJ SOLN
INTRAMUSCULAR | Status: AC
Start: 2014-08-02 — End: 2014-08-02
  Filled 2014-08-02: qty 1

## 2014-08-02 MED ORDER — CEFAZOLIN SODIUM-DEXTROSE 2-3 GM-% IV SOLR
INTRAVENOUS | Status: AC
  Filled 2014-08-02: qty 50

## 2014-08-02 MED ORDER — LACTATED RINGERS IV SOLN
INTRAVENOUS | Status: DC | PRN
  Administered 2014-08-02: 13:00:00

## 2014-08-02 MED ORDER — DIPHENHYDRAMINE HCL 50 MG/ML IJ SOLN: 12.5000 mg | Freq: Four times a day (QID) | INTRAMUSCULAR | Status: DC | PRN

## 2014-08-02 MED ORDER — NEOSTIGMINE METHYLSULFATE 10 MG/10ML IV SOLN
INTRAVENOUS | Status: DC | PRN
  Administered 2014-08-02: 4 mg via INTRAVENOUS

## 2014-08-02 MED ORDER — KETOROLAC TROMETHAMINE 15 MG/ML IJ SOLN
15.0000 mg | Freq: Four times a day (QID) | INTRAMUSCULAR | Status: DC
  Administered 2014-08-02 – 2014-08-03 (×4): 15 mg via INTRAVENOUS
  Filled 2014-08-02 (×7): qty 1

## 2014-08-02 MED ORDER — DEXAMETHASONE SODIUM PHOSPHATE 10 MG/ML IJ SOLN
INTRAMUSCULAR | Status: AC
  Filled 2014-08-02: qty 1

## 2014-08-02 MED ORDER — ACETAMINOPHEN 325 MG PO TABS: 650.0000 mg | ORAL_TABLET | ORAL | Status: DC | PRN

## 2014-08-02 MED ORDER — KCL IN DEXTROSE-NACL 20-5-0.45 MEQ/L-%-% IV SOLN
INTRAVENOUS | Status: DC
  Administered 2014-08-02 – 2014-08-03 (×3): via INTRAVENOUS
  Filled 2014-08-02 (×4): qty 1000

## 2014-08-02 MED ORDER — LACTATED RINGERS IV SOLN
INTRAVENOUS | Status: DC
  Administered 2014-08-02: 1000 mL via INTRAVENOUS
  Administered 2014-08-02: 14:00:00 via INTRAVENOUS

## 2014-08-02 MED ORDER — PROMETHAZINE HCL 25 MG/ML IJ SOLN: 6.2500 mg | INTRAMUSCULAR | Status: DC | PRN

## 2014-08-02 MED ORDER — GLYCOPYRROLATE 0.2 MG/ML IJ SOLN
INTRAMUSCULAR | Status: AC
  Filled 2014-08-02: qty 3

## 2014-08-02 MED ORDER — HEPARIN SODIUM (PORCINE) 1000 UNIT/ML IJ SOLN
INTRAMUSCULAR | Status: AC
  Filled 2014-08-02: qty 1

## 2014-08-02 MED ORDER — HYDROMORPHONE HCL 1 MG/ML IJ SOLN
0.2500 mg | INTRAMUSCULAR | Status: DC | PRN
  Administered 2014-08-02: 0.5 mg via INTRAVENOUS
  Administered 2014-08-02: 0.25 mg via INTRAVENOUS
  Administered 2014-08-02 (×2): 0.5 mg via INTRAVENOUS
  Administered 2014-08-02: 0.25 mg via INTRAVENOUS

## 2014-08-02 MED ORDER — ROCURONIUM BROMIDE 100 MG/10ML IV SOLN
INTRAVENOUS | Status: AC
  Filled 2014-08-02: qty 1

## 2014-08-02 MED ORDER — MEPERIDINE HCL 50 MG/ML IJ SOLN
INTRAMUSCULAR | Status: AC
  Filled 2014-08-02: qty 1

## 2014-08-02 MED ORDER — OXYCODONE HCL 5 MG/5ML PO SOLN
5.0000 mg | Freq: Once | ORAL | Status: DC | PRN
  Filled 2014-08-02: qty 5

## 2014-08-02 MED ORDER — MIDAZOLAM HCL 5 MG/5ML IJ SOLN
INTRAMUSCULAR | Status: DC | PRN
  Administered 2014-08-02: 2 mg via INTRAVENOUS

## 2014-08-02 MED ORDER — HYDROMORPHONE HCL 1 MG/ML IJ SOLN
INTRAMUSCULAR | Status: DC | PRN
  Administered 2014-08-02: 0.5 mg via INTRAVENOUS
  Administered 2014-08-02: 1 mg via INTRAVENOUS
  Administered 2014-08-02: 0.5 mg via INTRAVENOUS

## 2014-08-02 MED ORDER — ONDANSETRON HCL 4 MG/2ML IJ SOLN
INTRAMUSCULAR | Status: DC | PRN
  Administered 2014-08-02: 4 mg via INTRAVENOUS

## 2014-08-02 MED ORDER — FENTANYL CITRATE 0.05 MG/ML IJ SOLN
INTRAMUSCULAR | Status: DC | PRN
  Administered 2014-08-02 (×2): 50 ug via INTRAVENOUS
  Administered 2014-08-02: 100 ug via INTRAVENOUS
  Administered 2014-08-02: 50 ug via INTRAVENOUS

## 2014-08-02 MED ORDER — PROPOFOL 10 MG/ML IV BOLUS
INTRAVENOUS | Status: DC | PRN
  Administered 2014-08-02: 170 mg via INTRAVENOUS

## 2014-08-02 MED ORDER — HYDROCODONE-ACETAMINOPHEN 5-325 MG PO TABS: 1.0000 | ORAL_TABLET | Freq: Four times a day (QID) | ORAL | Status: DC | PRN

## 2014-08-02 MED ORDER — MEPERIDINE HCL 50 MG/ML IJ SOLN
6.2500 mg | INTRAMUSCULAR | Status: DC | PRN
  Administered 2014-08-02: 12.5 mg via INTRAVENOUS

## 2014-08-02 SURGICAL SUPPLY — 49 items
CABLE HIGH FREQUENCY MONO STRZ (ELECTRODE) ×3 IMPLANT
CATH FOLEY 2WAY SLVR 18FR 30CC (CATHETERS) ×3 IMPLANT
CATH ROBINSON RED A/P 16FR (CATHETERS) ×3 IMPLANT
CATH ROBINSON RED A/P 8FR (CATHETERS) ×3 IMPLANT
CATH TIEMANN FOLEY 18FR 5CC (CATHETERS) ×3 IMPLANT
CHLORAPREP W/TINT 26ML (MISCELLANEOUS) ×3 IMPLANT
CLIP LIGATING HEM O LOK PURPLE (MISCELLANEOUS) ×6 IMPLANT
CLOTH BEACON ORANGE TIMEOUT ST (SAFETY) ×3 IMPLANT
COVER SURGICAL LIGHT HANDLE (MISCELLANEOUS) ×3 IMPLANT
COVER TIP SHEARS 8 DVNC (MISCELLANEOUS) ×2 IMPLANT
COVER TIP SHEARS 8MM DA VINCI (MISCELLANEOUS) ×1
CUTTER ECHEON FLEX ENDO 45 340 (ENDOMECHANICALS) ×3 IMPLANT
DECANTER SPIKE VIAL GLASS SM (MISCELLANEOUS) ×3 IMPLANT
DRAPE SURG IRRIG POUCH 19X23 (DRAPES) ×3 IMPLANT
DRSG TEGADERM 4X4.75 (GAUZE/BANDAGES/DRESSINGS) ×3 IMPLANT
DRSG TEGADERM 6X8 (GAUZE/BANDAGES/DRESSINGS) ×6 IMPLANT
ELECT REM PT RETURN 9FT ADLT (ELECTROSURGICAL) ×3
ELECTRODE REM PT RTRN 9FT ADLT (ELECTROSURGICAL) ×2 IMPLANT
GLOVE BIO SURGEON STRL SZ 6.5 (GLOVE) ×3 IMPLANT
GLOVE BIOGEL M STRL SZ7.5 (GLOVE) ×6 IMPLANT
GOWN STRL REUS W/TWL LRG LVL3 (GOWN DISPOSABLE) ×9 IMPLANT
HOLDER FOLEY CATH W/STRAP (MISCELLANEOUS) ×3 IMPLANT
IV LACTATED RINGERS 1000ML (IV SOLUTION) ×3 IMPLANT
KIT ACCESSORY DA VINCI DISP (KITS) ×1
KIT ACCESSORY DVNC DISP (KITS) ×2 IMPLANT
LIQUID BAND (GAUZE/BANDAGES/DRESSINGS) IMPLANT
MANIFOLD NEPTUNE II (INSTRUMENTS) ×3 IMPLANT
NDL SAFETY ECLIPSE 18X1.5 (NEEDLE) ×2 IMPLANT
NEEDLE HYPO 18GX1.5 SHARP (NEEDLE) ×3
PACK ROBOT UROLOGY CUSTOM (CUSTOM PROCEDURE TRAY) ×3 IMPLANT
RELOAD GREEN ECHELON 45 (STAPLE) ×3 IMPLANT
SCISSORS LAP 5X45 EPIX DISP (ENDOMECHANICALS) ×1 IMPLANT
SET TUBE IRRIG SUCTION NO TIP (IRRIGATION / IRRIGATOR) ×3 IMPLANT
SOLUTION ELECTROLUBE (MISCELLANEOUS) ×3 IMPLANT
SUT ETHILON 3 0 PS 1 (SUTURE) ×3 IMPLANT
SUT MNCRL 3 0 RB1 (SUTURE) ×2 IMPLANT
SUT MNCRL 3 0 VIOLET RB1 (SUTURE) ×2 IMPLANT
SUT MNCRL AB 4-0 PS2 18 (SUTURE) ×6 IMPLANT
SUT MONOCRYL 3 0 RB1 (SUTURE) ×3
SUT VIC AB 0 CT1 27 (SUTURE) ×3
SUT VIC AB 0 CT1 27XBRD ANTBC (SUTURE) ×2 IMPLANT
SUT VIC AB 0 UR5 27 (SUTURE) ×3 IMPLANT
SUT VIC AB 2-0 SH 27 (SUTURE) ×3
SUT VIC AB 2-0 SH 27X BRD (SUTURE) ×2 IMPLANT
SUT VICRYL 0 UR6 27IN ABS (SUTURE) ×6 IMPLANT
SYR 27GX1/2 1ML LL SAFETY (SYRINGE) ×3 IMPLANT
TOWEL OR 17X26 10 PK STRL BLUE (TOWEL DISPOSABLE) ×3 IMPLANT
TOWEL OR NON WOVEN STRL DISP B (DISPOSABLE) ×3 IMPLANT
WATER STERILE IRR 1500ML POUR (IV SOLUTION) ×6 IMPLANT

## 2014-08-02 NOTE — Anesthesia Procedure Notes (Signed)
Procedure Name: Intubation Performed by: Anne Fu Pre-anesthesia Checklist: Patient identified, Emergency Drugs available, Suction available, Patient being monitored and Timeout performed Patient Re-evaluated:Patient Re-evaluated prior to inductionOxygen Delivery Method: Circle system utilized Preoxygenation: Pre-oxygenation with 100% oxygen Intubation Type: IV induction Ventilation: Mask ventilation without difficulty Laryngoscope Size: Mac and 4 Grade View: Grade I Tube type: Oral Tube size: 7.5 mm Number of attempts: 1 Airway Equipment and Method: Stylet Placement Confirmation: ETT inserted through vocal cords under direct vision,  positive ETCO2,  CO2 detector and breath sounds checked- equal and bilateral Secured at: 21 cm Tube secured with: Tape Dental Injury: Teeth and Oropharynx as per pre-operative assessment

## 2014-08-02 NOTE — Discharge Instructions (Signed)

## 2014-08-02 NOTE — Op Note (Addendum)
Preoperative diagnosis: Clinically localized adenocarcinoma of the prostate (clinical stage T2b N0 M0)  Postoperative diagnosis: Clinically localized adenocarcinoma of the prostate (clinical stage T2b N0 M0)  Procedure:  1. Robotic assisted laparoscopic radical prostatectomy (bilateral nerve sparing) 2. Bilateral robotic assisted laparoscopic pelvic lymphadenectomy  Surgeon: Pryor Curia. M.D.  Assistant: Debbrah Alar, PA-C  Anesthesia: General  Complications: None  EBL: 200 mL  IVF:  2000 mL crystalloid  Specimens: 1. Prostate and seminal vesicles 2. Right pelvic lymph nodes 3. Left pelvic lymph nodes  Disposition of specimens: Pathology  Drains: 1. 20 Fr coude catheter 2. # 19 Blake pelvic drain  Indication: [name] is a 68 y.o. year old patient with clinically localized prostate cancer.  After a thorough review of the management options for treatment of prostate cancer, he elected to proceed with surgical therapy and the above procedure(s).  We have discussed the potential benefits and risks of the procedure, side effects of the proposed treatment, the likelihood of the patient achieving the goals of the procedure, and any potential problems that might occur during the procedure or recuperation. Informed consent has been obtained.  Description of procedure:  The patient was taken to the operating room and a general anesthetic was administered. He was given preoperative antibiotics, placed in the dorsal lithotomy position, and prepped and draped in the usual sterile fashion. Next a preoperative timeout was performed. A urethral catheter was placed into the bladder and a site was selected near the umbilicus for placement of the camera port. This was placed using a standard open Hassan technique which allowed entry into the peritoneal cavity under direct vision and without difficulty. A 12 mm port was placed and a pneumoperitoneum established. The camera was then used  to inspect the abdomen and there was no evidence of any intra-abdominal injuries or other abnormalities. The remaining abdominal ports were then placed. 8 mm robotic ports were placed in the right lower quadrant, left lower quadrant, and far left lateral abdominal wall. A 5 mm port was placed in the right upper quadrant and a 12 mm port was placed in the right lateral abdominal wall for laparoscopic assistance. All ports were placed under direct vision without difficulty. The surgical cart was then docked.   Utilizing the cautery scissors, the bladder was reflected posteriorly allowing entry into the space of Retzius and identification of the endopelvic fascia and prostate. The periprostatic fat was then removed from the prostate allowing full exposure of the endopelvic fascia. The endopelvic fascia was then incised from the apex back to the base of the prostate bilaterally and the underlying levator muscle fibers were swept laterally off the prostate thereby isolating the dorsal venous complex. There was a left sided accessory obturator artery that was spared. The dorsal vein was then stapled and divided with a 45 mm Flex Echelon stapler. Attention then turned to the bladder neck which was divided anteriorly thereby allowing entry into the bladder and exposure of the urethral catheter. The catheter balloon was deflated and the catheter was brought into the operative field and used to retract the prostate anteriorly. The posterior bladder neck was then examined and was divided allowing further dissection between the bladder and prostate posteriorly until the vasa deferentia and seminal vessels were identified. During this dissection a small opening in the bladder was noted toward the right lateral bladder wall that was still tethered to the prostate pedicle.  This was identified and closed with a figure of eight 3-0 monocryl suture.  The vasa deferentia were isolated, divided, and lifted anteriorly. The seminal  vesicles were dissected down to their tips with care to control the seminal vascular arterial blood supply. These structures were then lifted anteriorly and the space between Denonvillier's fascia and the anterior rectum was developed with a combination of sharp and blunt dissection. This isolated the vascular pedicles of the prostate.  The lateral prostatic fascia was then sharply incised allowing release of the neurovascular bundles bilaterally. The vascular pedicles of the prostate were then ligated with Weck clips between the prostate and neurovascular bundles and divided with sharp cold scissor dissection resulting in neurovascular bundle preservation. The neurovascular bundles were then separated off the apex of the prostate and urethra bilaterally. A interfascial nerve sparing dissection was performed on the left side to leave a few mm of periprostatic fat with the prostate.  The urethra was then sharply transected allowing the prostate specimen to be disarticulated. The pelvis was copiously irrigated and hemostasis was ensured. There was no evidence for rectal injury.  Attention then turned to the right pelvic sidewall. The fibrofatty tissue between the external iliac vein, confluence of the iliac vessels, hypogastric artery, and Cooper's ligament was dissected free from the pelvic sidewall with care to preserve the obturator nerve. Weck clips were used for lymphostasis and hemostasis. An identical procedure was performed on the contralateral side and the lymphatic packets were removed for permanent pathologic analysis.  Attention then turned to the urethral anastomosis. A 2-0 Vicryl slip knot was placed between Denonvillier's fascia, the posterior bladder neck, and the posterior urethra to reapproximate these structures. A double-armed 3-0 Monocryl suture was then used to perform a 360 running tension-free anastomosis between the bladder neck and urethra. A new urethral catheter was then placed  into the bladder and irrigated. There were no blood clots within the bladder and the anastomosis appeared to be watertight. A #19 Blake drain was then brought through the left lateral 8 mm port site and positioned appropriately within the pelvis. It was secured to the skin with a nylon suture. The surgical cart was then undocked. The right lateral 12 mm port site was closed at the fascial level with a 0 Vicryl suture placed laparoscopically. All remaining ports were then removed under direct vision. The prostate specimen was removed intact within the Endopouch retrieval bag via the periumbilical camera port site. This fascial opening was closed with two running 0 Vicryl sutures. 0.25% Marcaine was then injected into all port sites and all incisions were reapproximated at the skin level with 4-0 Monocryl subcuticular sutures and Dermabond. The patient appeared to tolerate the procedure well and without complications. The patient was able to be extubated and transferred to the recovery unit in satisfactory condition.   Pryor Curia MD

## 2014-08-02 NOTE — Anesthesia Preprocedure Evaluation (Signed)
Anesthesia Evaluation  Patient identified by MRN, date of birth, ID band Patient awake    Reviewed: Allergy & Precautions, H&P , NPO status , Patient's Chart, lab work & pertinent test results  History of Anesthesia Complications Negative for: history of anesthetic complications  Airway Mallampati: II  TM Distance: >3 FB Neck ROM: Full    Dental no notable dental hx. (+)    Pulmonary pneumonia -,  Childhood asthma breath sounds clear to auscultation  Pulmonary exam normal       Cardiovascular Exercise Tolerance: Good hypertension, Rhythm:Regular Rate:Normal  whitecoat HTN, not currently taking any medications   Neuro/Psych  Headaches, negative psych ROS   GI/Hepatic negative GI ROS, Neg liver ROS,   Endo/Other  negative endocrine ROS  Renal/GU negative Renal ROS     Musculoskeletal  (+) Arthritis -,   Abdominal   Peds  Hematology negative hematology ROS (+)   Anesthesia Other Findings   Reproductive/Obstetrics negative OB ROS                             Anesthesia Physical  Anesthesia Plan  ASA: II  Anesthesia Plan: General   Post-op Pain Management:    Induction: Intravenous  Airway Management Planned: LMA  Additional Equipment:   Intra-op Plan:   Post-operative Plan: Extubation in OR  Informed Consent: I have reviewed the patients History and Physical, chart, labs and discussed the procedure including the risks, benefits and alternatives for the proposed anesthesia with the patient or authorized representative who has indicated his/her understanding and acceptance.   Dental advisory given  Plan Discussed with: CRNA  Anesthesia Plan Comments:         Anesthesia Quick Evaluation

## 2014-08-02 NOTE — Progress Notes (Signed)
Utilization review completed.  

## 2014-08-02 NOTE — Plan of Care (Signed)
Problem: Phase I Progression Outcomes Goal: Pain controlled with appropriate interventions Outcome: Progressing Goal: NPO except ice chips or as ordered Outcome: Progressing Goal: Foley/JP patent Outcome: Completed/Met Date Met:  08/02/14 Goal: Incision/dressing intact Outcome: Completed/Met Date Met:  08/02/14 Goal: Adequate I & O Outcome: Progressing

## 2014-08-02 NOTE — Transfer of Care (Signed)
Immediate Anesthesia Transfer of Care Note  Patient: [name]  Procedure(s) Performed: Procedure(s) (LRB): ROBOTIC ASSISTED LAPAROSCOPIC RADICAL PROSTATECTOMY LEVEL 2 (N/A) LYMPHADENECTOMY (Bilateral)  Patient Location: PACU  Anesthesia Type: General  Level of Consciousness: sedated, patient cooperative and responds to stimulation  Airway & Oxygen Therapy: Patient Spontanous Breathing and Patient connected to face mask oxgen  Post-op Assessment: Report given to PACU RN and Post -op Vital signs reviewed and stable  Post vital signs: Reviewed and stable  Complications: No apparent anesthesia complications

## 2014-08-02 NOTE — Progress Notes (Signed)
Patient ID: [name], male   DOB: [birth year], 68 y.o.   MRN: [id number]  Post-op note  Subjective: The patient is doing well.  No complaints.  Objective: Vital signs in last 24 hours: Temp:  [97.5 F (36.4 C)-98.4 F (36.9 C)] 98 F (36.7 C) (12/10 1549) Pulse Rate:  [62-85] 68 (12/10 1549) Resp:  [10-18] 11 (12/10 1530) BP: (122-150)/(66-78) 131/66 mmHg (12/10 1549) SpO2:  [100 %] 100 % (12/10 1549) Weight:  [82.101 kg (181 lb)] 82.101 kg (181 lb) (12/10 0920)  Intake/Output from previous day:   Intake/Output this shift:    Physical Exam:  General: Alert and oriented. Abdomen: Soft, Nondistended. Incisions: Clean and dry. GU: Urine clear  Lab Results:  Recent Labs  08/02/14 1532  HGB 12.0*  HCT 36.1*    Assessment/Plan: POD#0   1) Ambulate, IS   Pryor Curia. MD   LOS: 0 days   Damichael Hofman,LES 08/02/2014, 7:27 PM

## 2014-08-02 NOTE — Anesthesia Postprocedure Evaluation (Signed)
Anesthesia Post Note  Patient: [name]  Procedure(s) Performed: Procedure(s) (LRB): ROBOTIC ASSISTED LAPAROSCOPIC RADICAL PROSTATECTOMY LEVEL 2 (N/A) LYMPHADENECTOMY (Bilateral)  Anesthesia type: General  Patient location: PACU  Post pain: Pain level controlled  Post assessment: Post-op Vital signs reviewed  Last Vitals: BP 131/66 mmHg  Pulse 68  Temp(Src) 36.7 C (Oral)  Resp 11  Ht 6\' 1"  (1.854 m)  Wt 181 lb (82.101 kg)  BMI 23.89 kg/m2  SpO2 100%  Post vital signs: Reviewed  Level of consciousness: sedated  Complications: No apparent anesthesia complications

## 2014-08-02 NOTE — Progress Notes (Signed)
Pt arrived to 1420 from PACU in stable condition.  Resting comfortably in bed.  C/o "discomfort" rated at 9/10 pain but drifts off to sleep immediately afterward.  Will monitor.  Coolidge Breeze, RN 08/02/2014

## 2014-08-03 ENCOUNTER — Encounter (HOSPITAL_COMMUNITY): Payer: Self-pay | Admitting: Urology

## 2014-08-03 LAB — HEMOGLOBIN AND HEMATOCRIT, BLOOD
HEMATOCRIT: 33.6 % — AB (ref 39.0–52.0)
Hemoglobin: 11.3 g/dL — ABNORMAL LOW (ref 13.0–17.0)

## 2014-08-03 MED ORDER — BISACODYL 10 MG RE SUPP
10.0000 mg | Freq: Once | RECTAL | Status: AC
  Administered 2014-08-03: 10 mg via RECTAL
  Filled 2014-08-03: qty 1

## 2014-08-03 MED ORDER — HYDROCODONE-ACETAMINOPHEN 5-325 MG PO TABS
1.0000 | ORAL_TABLET | Freq: Four times a day (QID) | ORAL | Status: DC | PRN
  Administered 2014-08-03: 1 via ORAL
  Filled 2014-08-03: qty 1

## 2014-08-03 NOTE — Progress Notes (Signed)
Leg bag education completed. Pt able to demonstrate change leg bag to foley bag. Anu Stagner A

## 2014-08-03 NOTE — Plan of Care (Signed)
Problem: Phase I Progression Outcomes Goal: Pain controlled with appropriate interventions Outcome: Completed/Met Date Met:  08/03/14 Goal: NPO except ice chips or as ordered Outcome: Completed/Met Date Met:  08/03/14 Goal: Walk in halls when awake from anesthesia Outcome: Completed/Met Date Met:  08/03/14

## 2014-08-03 NOTE — Discharge Summary (Signed)
  Date of admission: 08/02/2014  Date of discharge: 08/03/2014  Admission diagnosis: Prostate Cancer  Discharge diagnosis: Prostate Cancer  History and Physical: For full details, please see admission history and physical. Briefly, [name] is a 68 y.o. gentleman with localized prostate cancer.  After discussing management/treatment options, he elected to proceed with surgical treatment.  Hospital Course: [name] was taken to the operating room on 08/02/2014 and underwent a robotic assisted laparoscopic radical prostatectomy. He tolerated this procedure well and without complications. Postoperatively, he was able to be transferred to a regular hospital room following recovery from anesthesia.  He was able to begin ambulating the night of surgery. He remained hemodynamically stable overnight.  He had excellent urine output with appropriately minimal output from his pelvic drain and his pelvic drain was removed on POD #1.  He was transitioned to oral pain medication, tolerated a clear liquid diet, and had met all discharge criteria and was able to be discharged home later on POD#1.  Laboratory values:  Recent Labs  08/02/14 1532 08/03/14 0425  HGB 12.0* 11.3*  HCT 36.1* 33.6*    Disposition: Home  Discharge instruction: He was instructed to be ambulatory but to refrain from heavy lifting, strenuous activity, or driving. He was instructed on urethral catheter care.  Discharge medications:     Medication List    STOP taking these medications        acetaminophen-codeine 300-30 MG per tablet  Commonly known as:  TYLENOL #3     CHLORPHENIRAMINE MALEATE PO     multivitamin with minerals tablet     vitamin B-12 1000 MCG tablet  Commonly known as:  CYANOCOBALAMIN      TAKE these medications        diazepam 5 MG tablet  Commonly known as:  VALIUM  Take 1 tablet (5 mg total) by mouth at bedtime as needed for anxiety or sedation (take at night to help with insomnia).     HYDROcodone-acetaminophen 5-325 MG per tablet  Commonly known as:  NORCO  Take 1-2 tablets by mouth every 6 (six) hours as needed.     sulfamethoxazole-trimethoprim 800-160 MG per tablet  Commonly known as:  BACTRIM DS,SEPTRA DS  Take 1 tablet by mouth 2 (two) times daily. START THIS MEDICATION ON THE DAY BEFORE YOUR FOLLOW UP APPOINTMENT FOR FOLEY REMOVAL        Followup: He will followup in 1 week for catheter removal and to discuss his surgical pathology results.

## 2014-08-03 NOTE — Progress Notes (Signed)
Patient ID: [name], male   DOB: 1945-11-15, 68 y.o.   MRN: [id number]  1 Day Post-Op Subjective: The patient is doing well.  No nausea or vomiting. Pain is adequately controlled.  Objective: Vital signs in last 24 hours: Temp:  [97.5 F (36.4 C)-98.4 F (36.9 C)] 98.1 F (36.7 C) (12/11 0431) Pulse Rate:  [51-85] 51 (12/11 0431) Resp:  [10-18] 16 (12/11 0431) BP: (109-150)/(57-78) 109/63 mmHg (12/11 0431) SpO2:  [97 %-100 %] 99 % (12/11 0431) Weight:  [82.101 kg (181 lb)] 82.101 kg (181 lb) (12/10 0920)  Intake/Output from previous day: 12/10 0701 - 12/11 0700 In: 4435 [P.O.:240; I.V.:3145; IV Piggyback:1050] Out: 1295 [Urine:1050; Drains:45; Blood:200] Intake/Output this shift: Total I/O In: 0051 [P.O.:240; I.V.:1145; IV Piggyback:50] Out: 35 [Urine:850; Drains:45]  Physical Exam:  General: Alert and oriented. CV: RRR Lungs: Clear bilaterally. GI: Soft, Nondistended. Incisions: Clean, dry, and intact Urine: Clear Extremities: Nontender, no erythema, no edema.  Lab Results:  Recent Labs  08/02/14 1532 08/03/14 0425  HGB 12.0* 11.3*  HCT 36.1* 33.6*      Assessment/Plan: POD# 1 s/p robotic prostatectomy.  1) SL IVF 2) Ambulate, Incentive spirometry 3) Transition to oral pain medication 4) Dulcolax suppository 5) D/C pelvic drain 6) Plan for likely discharge later today   [name]. MD   LOS: 1 day   [name],LES 08/03/2014, 7:00 AM

## 2014-08-21 ENCOUNTER — Other Ambulatory Visit: Payer: Self-pay | Admitting: Dermatology

## 2014-08-21 DIAGNOSIS — D485 Neoplasm of uncertain behavior of skin: Secondary | ICD-10-CM | POA: Diagnosis not present

## 2014-08-21 DIAGNOSIS — D2362 Other benign neoplasm of skin of left upper limb, including shoulder: Secondary | ICD-10-CM | POA: Diagnosis not present

## 2014-08-21 DIAGNOSIS — D1800 Hemangioma unspecified site: Secondary | ICD-10-CM | POA: Diagnosis not present

## 2014-08-21 DIAGNOSIS — D225 Melanocytic nevi of trunk: Secondary | ICD-10-CM | POA: Diagnosis not present

## 2014-08-21 DIAGNOSIS — D1801 Hemangioma of skin and subcutaneous tissue: Secondary | ICD-10-CM | POA: Diagnosis not present

## 2014-08-21 DIAGNOSIS — Z85828 Personal history of other malignant neoplasm of skin: Secondary | ICD-10-CM | POA: Diagnosis not present

## 2014-08-21 DIAGNOSIS — L570 Actinic keratosis: Secondary | ICD-10-CM | POA: Diagnosis not present

## 2014-08-21 DIAGNOSIS — L738 Other specified follicular disorders: Secondary | ICD-10-CM | POA: Diagnosis not present

## 2014-08-31 DIAGNOSIS — C61 Malignant neoplasm of prostate: Secondary | ICD-10-CM | POA: Diagnosis not present

## 2014-08-31 DIAGNOSIS — M6281 Muscle weakness (generalized): Secondary | ICD-10-CM | POA: Diagnosis not present

## 2014-09-11 DIAGNOSIS — H2513 Age-related nuclear cataract, bilateral: Secondary | ICD-10-CM | POA: Diagnosis not present

## 2014-09-13 DIAGNOSIS — C61 Malignant neoplasm of prostate: Secondary | ICD-10-CM | POA: Diagnosis not present

## 2014-09-13 DIAGNOSIS — M6281 Muscle weakness (generalized): Secondary | ICD-10-CM | POA: Diagnosis not present

## 2014-09-20 DIAGNOSIS — C61 Malignant neoplasm of prostate: Secondary | ICD-10-CM | POA: Diagnosis not present

## 2014-10-11 DIAGNOSIS — M6281 Muscle weakness (generalized): Secondary | ICD-10-CM | POA: Diagnosis not present

## 2014-10-11 DIAGNOSIS — C61 Malignant neoplasm of prostate: Secondary | ICD-10-CM | POA: Diagnosis not present

## 2014-11-01 DIAGNOSIS — M6281 Muscle weakness (generalized): Secondary | ICD-10-CM | POA: Diagnosis not present

## 2014-11-01 DIAGNOSIS — C61 Malignant neoplasm of prostate: Secondary | ICD-10-CM | POA: Diagnosis not present

## 2014-11-29 DIAGNOSIS — M6281 Muscle weakness (generalized): Secondary | ICD-10-CM | POA: Diagnosis not present

## 2014-11-29 DIAGNOSIS — C61 Malignant neoplasm of prostate: Secondary | ICD-10-CM | POA: Diagnosis not present

## 2014-12-03 DIAGNOSIS — D1801 Hemangioma of skin and subcutaneous tissue: Secondary | ICD-10-CM | POA: Diagnosis not present

## 2014-12-03 DIAGNOSIS — Z85828 Personal history of other malignant neoplasm of skin: Secondary | ICD-10-CM | POA: Diagnosis not present

## 2014-12-03 DIAGNOSIS — L218 Other seborrheic dermatitis: Secondary | ICD-10-CM | POA: Diagnosis not present

## 2014-12-03 DIAGNOSIS — L280 Lichen simplex chronicus: Secondary | ICD-10-CM | POA: Diagnosis not present

## 2014-12-04 ENCOUNTER — Telehealth: Payer: Self-pay | Admitting: Medical Oncology

## 2014-12-04 DIAGNOSIS — N393 Stress incontinence (female) (male): Secondary | ICD-10-CM | POA: Diagnosis not present

## 2014-12-04 DIAGNOSIS — R278 Other lack of coordination: Secondary | ICD-10-CM | POA: Diagnosis not present

## 2014-12-04 DIAGNOSIS — M6281 Muscle weakness (generalized): Secondary | ICD-10-CM | POA: Diagnosis not present

## 2014-12-04 NOTE — Telephone Encounter (Signed)
I spoke with Mr. Schoen to confirm he has an appointment at [facility] to have a PSA drawn. He states he will have it drawn April 19 th.    Cira Rue, RN, BSN, [place]  [phone] Fax [phone]

## 2014-12-04 NOTE — Telephone Encounter (Signed)
I called pt to introduce myself as the Prostate Nurse Navigator and the Coordinator of the [facility] [place].  1. I confirmed with the patient he is aware of his referral to the clinic 12/18/2014 arriving at 12:15pm  2. I discussed the format of the clinic and the physicians he will be seeing that day during clinic. I also asked the patient to have lunch before he comes due to the length of the clinic.  3. I discussed where the clinic is located, [place] parking  and how to contact me.  4. I confirmed his address and informed him I would be mailing a packet of information and forms to be completed. I asked him to bring them with him the day of his appointment.   He voiced understanding of the above. I asked him to call me if he has any questions or concerns regarding his appointments or the forms he needs to complete.   Cira Rue, RN, BSN, [place]  [phone] Fax [phone]

## 2014-12-11 DIAGNOSIS — M6281 Muscle weakness (generalized): Secondary | ICD-10-CM | POA: Diagnosis not present

## 2014-12-11 DIAGNOSIS — N393 Stress incontinence (female) (male): Secondary | ICD-10-CM | POA: Diagnosis not present

## 2014-12-11 DIAGNOSIS — R278 Other lack of coordination: Secondary | ICD-10-CM | POA: Diagnosis not present

## 2014-12-11 DIAGNOSIS — C61 Malignant neoplasm of prostate: Secondary | ICD-10-CM | POA: Diagnosis not present

## 2014-12-14 ENCOUNTER — Encounter: Payer: Self-pay | Admitting: Radiation Oncology

## 2014-12-14 NOTE — Progress Notes (Signed)
GU Location of Tumor / Histology: Adenocarcinoma of the Prostate 08/02/14 Diagnosis 1. Prostate, radical resection - PROSTATIC ADENOCARCINOMA, GLEASON SCORE 3 + 4 = 7/10, INVOLVING BOTH PROSTATE LOBES. - ADENOCARCINOMA IS BROADLY PRESENT AT THE RIGHT POSTERIOR MARGIN NEAR BASE. - PERINEURAL INVASION IS IDENTIFIED, DIFFUSE. - SEE ONCOLOGY TABLE BELOW. 2. Lymph nodes, regional resection, right pelvic - THERE IS NO EVIDENCE OF CARCINOMA IN 6 OF 6 LYMPH NODES (0/6). 3. Lymph node, biopsy, left pelvic - THERE IS NO EVIDENCE OF CARCINOMA IN 1 OF 1 LYMPH NODE (0/1).  05/18/14 Diagnosis 1. Prostate, needle biopsy(ies), left lateral base - BENIGN PROSTATIC TISSUE. - NO EVIDENCE OF MALIGNANCY. 2. Prostate, needle biopsy(ies), left lateral mid - PROSTATIC ADENOCARCINOMA, GLEASON SCORE 4 + 3 = 7, INVOLVING 60% OF THE TISSUE, TWO OF TWO CORES. - NO PERINEURAL INVASION IDENTIFIED. 3. Prostate, needle biopsy(ies), left lateral apex - PROSTATIC ADENOCARCINOMA, GLEASON SCORE 4 + 3 = 7, INVOLVING 80% OF THE TISSUE, ONE OF ONE CORE. - PERINEURAL INVASION PRESENT. 4. Prostate, needle biopsy(ies), left medial base x2 - BENIGN PROSTATIC TISSUE. - NO EVIDENCE OF MALIGNANCY. 5. Prostate, needle biopsy(ies), left medial mid x2 - PROSTATIC ADENOCARCINOMA, GLEASON SCORE 3 + 4 = 7, INVOLVING 20% OF THE TISSUE, ONE OF TWO CORES. - NO PERINEURAL INVASION IDENTIFIED. 6. Prostate, needle biopsy(ies), left medial apex - PROSTATIC ADENOCARCINOMA, GLEASON SCORE 3 + 4 = 7, INVOLVING 95% OF THE TISSUE, ONE OF ONE CORE. - PERINEURAL INVASION PRESENT.. 7. Prostate, needle biopsy(ies), right lateral base - PROSTATIC ADENOCARCINOMA, GLEASON SCORE 3 + 4 = 7, INVOLVING 70% OF THE TISSUE, ONE OF ONE CORE. - NO PERINEURAL INVASION IDENTIFIED. 8. Prostate, needle biopsy(ies), right lateral mid - PROSTATIC ADENOCARCINOMA, GLEASON SCORE 3 + 3 = 6, INVOLVING 60% OF THE TISSUE, ONE OF ONE CORE. - NO PERINEURAL INVASION  IDENTIFIED. 9. Prostate, needle biopsy(ies), right lateral apex - FIBROMUSCULAR TISSUE. - NO EVIDENCE OF MALIGNANCY. 1 of 3 FINAL for [name], [name] ([id number]) Diagnosis(continued) 10. Prostate, needle biopsy(ies), right medial base - BENIGN PROSTATIC TISSUE, NO EVIDENCE OF MALIGNANCY. 11. Prostate, needle biopsy(ies), right medial mid - BENIGN PROSTATIC TISSUE WITH GLANDULAR ATROPHY AND ASSOCIATED CHRONIC INFLAMMATION. NO EVIDENCE OF MALIGNANCY. 12. Prostate, needle biopsy(ies), right medial apex - PROSTATIC ADENOCARCINOMA, GLEASON SCORE 3 + 3 = 6, INVOLVING LESS THAN 10% OF THE TISSUE, ONE OF ONE CORE. - NO PERINEURAL INVASION IDENTIFIED  If Prostate Cancer, Gleason Score is (3 + 4) and PSA is (14.46)  August Albino - on 09/25/14   Past/Anticipated interventions by urology, if any: Dr. Raynelle Bring: Robotic Assisted laparoscopic Radical Prostatectomy ( Bilateral Nerve Sparing) and Bilateral robotic Assisted laparoscopic Lymphadenopathy  Past/Anticipated interventions by medical oncology, if any: Unknown  Weight changes, if any:   Bowel/Bladder complaints, if any: Some incontinence-leakage on activity- Pelvic Floor muscle training  Nausea/Vomiting, if any:   Pain issues, if any:    SAFETY ISSUES:  Prior radiation? No  Pacemaker/ICD? No  Possible current pregnancy? N/A  Is the patient on methotrexate? No  Current Complaints / other details: Erectile Dysfunction continues on Cialis

## 2014-12-17 ENCOUNTER — Telehealth: Payer: Self-pay | Admitting: Medical Oncology

## 2014-12-17 DIAGNOSIS — N393 Stress incontinence (female) (male): Secondary | ICD-10-CM | POA: Diagnosis not present

## 2014-12-17 DIAGNOSIS — M6281 Muscle weakness (generalized): Secondary | ICD-10-CM | POA: Diagnosis not present

## 2014-12-17 DIAGNOSIS — R278 Other lack of coordination: Secondary | ICD-10-CM | POA: Diagnosis not present

## 2014-12-17 NOTE — Telephone Encounter (Signed)
I called pt to confirm his appointment for the Prostate [place] for 4/26 arriving at 12:15pm. He asked if he has to complete the packet of paper work since filled out paper work when he came to see Dr. Valere Dross the first time. I informed him we can just update his allergies and medications when he comes. He is aware where [facility] is located and I asked him to register in radiation and to make sure he has some lunch before he comes. He voiced understanding.    Cira Rue, RN, BSN, [place]  [phone]  Fax [phone]

## 2014-12-18 ENCOUNTER — Ambulatory Visit
Admission: RE | Admit: 2014-12-18 | Discharge: 2014-12-18 | Disposition: A | Discharge status: Home / Self Care | Payer: Medicare Other | Source: Ambulatory Visit | Attending: Radiation Oncology | Admitting: Radiation Oncology

## 2014-12-18 ENCOUNTER — Encounter: Payer: Self-pay | Admitting: Medical Oncology

## 2014-12-18 ENCOUNTER — Ambulatory Visit: Payer: Medicare Other | Admitting: Oncology

## 2014-12-18 VITALS — BP 154/73 | HR 65 | Temp 98.0°F | Resp 16 | Ht 73.0 in | Wt 179.5 lb

## 2014-12-18 DIAGNOSIS — C61 Malignant neoplasm of prostate: Secondary | ICD-10-CM | POA: Diagnosis not present

## 2014-12-18 DIAGNOSIS — N529 Male erectile dysfunction, unspecified: Secondary | ICD-10-CM | POA: Diagnosis not present

## 2014-12-18 DIAGNOSIS — N393 Stress incontinence (female) (male): Secondary | ICD-10-CM | POA: Diagnosis not present

## 2014-12-18 HISTORY — DX: Male erectile dysfunction, unspecified: N52.9

## 2014-12-18 HISTORY — DX: Stress incontinence (female) (male): N39.3

## 2014-12-18 NOTE — Consult Note (Signed)
Reason For Visit  Reason for consult: To discuss treatment options for prostate cancer s/p radical prostatectomy. Location of consultation: [facility]   History of Present Illness  [name] is 69 years old with prostate cancer s/p a BNS RAL radical prostatectomy and BPLND on 08/02/14.  Diagnosis: pT2c N0 Mx, Gleason 3+4=7 adenocarcinoma with a broad positive margin at the right posterior base Pretreatment PSA: 14.46 Pretreatment SHIM: 22  Interval history:  He follows up today for further discussion regarding surveillance and further treatment options for prostate cancer.  He continues to see gradual improvement with regard to his continence.  He is currently using 2-3 pads per day.  He typically will have fairly minimal leakage during the daytime but has significant leakage one more active.  He has restarted cycling and typically will have a fairly soaked pad after cycling.  He feels that he has made some gradual progress over the past 6 weeks although admits that it has been fairly gradual.  Although he feels that he is using more pads and he was 2 months ago, he attributes this simply to his increased activity.  Overall, he feels that his leakage is removed when inactive compared to a couple of months ago.     Past Medical History  1. History of arthritis (Z87.39)  Surgical History  1. History of Laparoscopy With Bilateral Total Pelvic Lymphadenectomy  2. History of Prostatect Retropubic Radical W/ Nerve Sparing Laparoscopic  Current Meds  1. Cialis 5 MG Oral Tablet; TAKE 1 TABLET DAILY AS DIRECTED;  Therapy: [id number] to (Evaluate:13Jun2016)  Requested for: 17OHY0737; Last  Rx:16Dec2015 Ordered  2. Pyridium 100 MG Oral Tablet; TAKE 1 TABLET Every 8 hours PRN urinary pain;  Therapy: [phone] to (Last Rx:26Feb2016)  Requested for: 26Feb2016 Ordered  Allergies  1. Doxycycline Hyclate CAPS  2. levofloxacin  3. Lisinopril  TABS  4. Nitrofurantoin CAPS  5. Penicillins  Family History  1. No pertinent family history : Mother  Social History   Alcohol use (Z78.9)   Married   Never a smoker  Physical Exam Constitutional: Well nourished and well developed . No acute distress.    Results/Data Selected Results  PSA 19Apr2016 08:04AM Raynelle Bring  SPECIMEN TYPE: BLOOD1 WEEK PRIOR TO [place]   Test Name Result Flag Reference  PSA <0.01 ng/mL  <=4.00  RESULT REPEATED AND VERIFIED. TEST METHODOLOGY: ECLIA PSA (ELECTROCHEMILUMINESCENCE IMMUNOASSAY)   Assessment   1. Prostate cancer (C61)  2. Erectile dysfunction (N52.9)  Male stress incontinence (788.32) (N39.3)       Discussion/Summary  1.  Prostate cancer: I have reviewed his radical prostatectomy slides in conference today with Dr. Nicoletta Dress.  Although his pathology report suggested a broad positive margin, on further review of the slides, he had one he focal positive margin involvement definitely but other arias where he is actually not seen raising the question of whether he truly had broad margin involvement or multiple areas of involvement. Dr. Nicoletta Dress has suggested that further tissue be resubmitted for further evaluation of the true margin to further determine the true extent of margin involvement.  This will be performed.  I discussed this finding with Mr. Yeakle and his wife today.  He understands how this might potentially alter his risk for locally recurrent disease.  We did discuss the potential advantages and disadvantages and the current data that exist regarding the role of adjuvant or salvage radiation therapy.  Finally, his PSA is undetectable at this time  which allows him flexibility with his options.  He will meet with Dr. Valere Dross today to further discuss radiation therapy in the adjuvant setting.  We plan to await the information from pathology regarding true extent of margin involvement and we'll make a further plan for follow-up pending that  information.  At the very least, I will likely have him follow-up in the next 4-6 months with a PSA prior to his appointment.  If he is interested in proceeding with adjuvant radiation therapy, I'll discuss taking a more aggressive approach with this physical therapy.  Cc: Dr. Arloa Koh Dr. Alysia Penna   A total of 35 minutes were spent in the overall care of the patient today with 35 minutes in direct face to face consultation.    Signatures Electronically signed by : Raynelle Bring, M.D.; Dec 18 2014  4:32PM EST

## 2014-12-18 NOTE — [facility] Oncology Navigator Note (Signed)
                               Care Plan Summary  Name: [name] DOB: [birth year]   Your Medical Team:   Urologist -  Dr. Raynelle Bring, [facility]  Radiation Oncologist - Dr. Arloa Koh, [facility]   Medical Oncologist - Dr. Zola Button, [place]  Recommendations: 1) Further pathology studies on surgical tissue 2) Depending results, Dr. Valere Dross will decide if and when you will receive radiation    * These recommendations are based on information available as of today's consult.      Recommendations may change depending on the results of further tests or exams.    Next Steps: 1) Dr. Alinda Money will call you after pathology studies are resulted.  2) Dr. Charlton Amor will contact you if and when you need to be scheduled for radiation.   When appointments need to be scheduled, you will be contacted by [facility] and/or [facility].  Questions?  Please do not hesitate to call Cira Rue, RN, BSN, CRNI at [phone] any questions or concerns.  Shirlean Mylar is your Oncology Nurse Navigator and is available to assist you while you're receiving your medical care at [facility].

## 2014-12-18 NOTE — Progress Notes (Signed)
CC:, Dr. Delma Freeze, Dr. Dutch Gray  Follow-up note:  Diagnosis: Pathologic Stage T2c adenocarcinoma prostate  History: Mr. Graefe is a pleasant 69 year old male who is seen today at the multidisciplinary clinic for discussion of possible radiation therapy in the management of his pathologic stageT2c adenocarcinoma prostate.  I first saw the patient in consultation on 06/07/2014.  He saw Dr. Louis Meckel for left testicular pain felt to be related to biking . He had not had a PSA for 5 years and this was obtained on 03/02/2014 revealing a PSA of 14.5. A repeat PSA on 04/13/2014 was 15.1. He underwent ultrasound-guided biopsies on 05/18/2014 by Dr. Louis Meckel. His was found to have Gleason 7 (4+3) involving 60% of tissue and 2 of 2 cores from the left lateral mid gland, and 80% of one core from the left lateral apex. He had Gleason 7 (3+4) involving 20% of tissue in one of 2 cores from the left medial mid gland, 95% of tissue in one of one core from the left medial apex, 70% of one of one core from the right lateral base. He Gleason 6 (3+3) involving 60% of tissue in one of one core from the right lateral mid gland and less than 10% of one core from the right medial apex. His gland volume was approximately 42 cc. His I PSS score was 3 and he was potent.  He underwent a robotic prostatectomy by Dr. Alinda Money on 08/02/2014.  He was found have Gleason 7 (3+4) involving both lobes, involving 30-35% of the prostate.  Extraprostatic extension was not identified.  There was felt to be Gleason grade 3  present at the right posterior margin near the base.  There was diffuse perineural invasion.  7 lymph nodes were free of metastatic disease.  His pathology was reviewed for our multidisciplinary conference and he is felt to have focal margin positivity, but the area that was felt to be broadly present at the surgical margin did not have any ink, and further sections are recommended.  His urinary continence continues to improve,  however he uses up to 3 pads a day.  He does ride his bicycle regularly.  Erectile function has not yet returned.  His postoperative PSA on 12/11/2014 was less than 0.01, undetectable.  Physical examination: Alert and oriented. Filed Vitals:   12/18/14 1255  BP: 154/73  Pulse: 65  Temp: 98 F (36.7 C)  Resp: 16   Rectal examination not performed today.  Impression: Pathologic stage T2c adenocarcinoma prostate.  We discussed the need to have his pathology reviewed.  If his margin is broadly positive that he will probably require radiation therapy at some point in time and we will then have a discussion of adjuvant versus salvage radiation therapy.  With a broadly positive margin, I would favor adjuvant radiation therapy when we think he has maximized his urinary rehabilitation, provided that his PSA remains undetectable.  If his margin is felt to be focal, then I would favor close observation and salvage radiation therapy, preferably before his PSA reaches 0.2.  I gave him literature for review.  We discussed predictors for local recurrence alone, and successful salvage radiation therapy.  A positive surgical margin is the strongest predictor for local recurrence alone.  Also in his favor would be a disease-free interval.  We discussed the potential acute and late toxicities of radiation therapy, and also bladder filling to minimize urinary toxicity.  Plan: His pathology will be reviewed.  If he has a focal positive margin  then we can strongly consider observation and salvage radiation therapy if he develops a PSA recurrence.  If his margin is broadly positive then I would favor adjuvant radiation therapy when he has maximized his urinary rehabilitation potential provided that his PSA remains undetectable.  I'm more than happy see him in the future when Dr. Alinda Money feels it would be appropriate.  30 minutes was spent face-to-face with the patient, primarily counseling the patient.

## 2014-12-31 DIAGNOSIS — M6281 Muscle weakness (generalized): Secondary | ICD-10-CM | POA: Diagnosis not present

## 2014-12-31 DIAGNOSIS — N393 Stress incontinence (female) (male): Secondary | ICD-10-CM | POA: Diagnosis not present

## 2014-12-31 DIAGNOSIS — R278 Other lack of coordination: Secondary | ICD-10-CM | POA: Diagnosis not present

## 2015-01-10 DIAGNOSIS — M6281 Muscle weakness (generalized): Secondary | ICD-10-CM | POA: Diagnosis not present

## 2015-01-10 DIAGNOSIS — R278 Other lack of coordination: Secondary | ICD-10-CM | POA: Diagnosis not present

## 2015-01-10 DIAGNOSIS — N393 Stress incontinence (female) (male): Secondary | ICD-10-CM | POA: Diagnosis not present

## 2015-01-15 DIAGNOSIS — Z85828 Personal history of other malignant neoplasm of skin: Secondary | ICD-10-CM | POA: Diagnosis not present

## 2015-01-15 DIAGNOSIS — L820 Inflamed seborrheic keratosis: Secondary | ICD-10-CM | POA: Diagnosis not present

## 2015-01-18 DIAGNOSIS — M6281 Muscle weakness (generalized): Secondary | ICD-10-CM | POA: Diagnosis not present

## 2015-01-18 DIAGNOSIS — R278 Other lack of coordination: Secondary | ICD-10-CM | POA: Diagnosis not present

## 2015-01-18 DIAGNOSIS — N393 Stress incontinence (female) (male): Secondary | ICD-10-CM | POA: Diagnosis not present

## 2015-02-06 DIAGNOSIS — N393 Stress incontinence (female) (male): Secondary | ICD-10-CM | POA: Diagnosis not present

## 2015-02-06 DIAGNOSIS — M6281 Muscle weakness (generalized): Secondary | ICD-10-CM | POA: Diagnosis not present

## 2015-02-06 DIAGNOSIS — R278 Other lack of coordination: Secondary | ICD-10-CM | POA: Diagnosis not present

## 2015-02-18 ENCOUNTER — Other Ambulatory Visit: Payer: Self-pay

## 2015-02-21 ENCOUNTER — Ambulatory Visit (INDEPENDENT_AMBULATORY_CARE_PROVIDER_SITE_OTHER): Payer: Medicare Other | Admitting: Family Medicine

## 2015-02-21 ENCOUNTER — Encounter: Payer: Self-pay | Admitting: Family Medicine

## 2015-02-21 VITALS — BP 130/72 | HR 54 | Temp 98.2°F | Wt 178.0 lb

## 2015-02-21 DIAGNOSIS — R202 Paresthesia of skin: Secondary | ICD-10-CM | POA: Diagnosis not present

## 2015-02-21 NOTE — Patient Instructions (Signed)
I am going to check in with Dr. Sarajane Jews tomorrow.   I think seeing how things go off the bike for 3 weeks may give [place] more information. Possibly related to cycling. Hold off on labs for now. If improves off cycling, consider sports medicine. If not, consider further evaluation, imaging or nerve conduction study

## 2015-02-21 NOTE — Progress Notes (Addendum)
Garret Reddish, MD  Subjective:  [name] is a 69 y.o. year old very pleasant male patient who presents with:  Tingling in bilateral feet -Monday noted soles of feet tingling. Persistent all week. Never had symptoms like this before. Cycles and has increased mileage but no other changes. 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 Years of pain without clear recent worsening. No position makes it better or worse. Feels it all day long when he is thinking about it. Has even woken him up from sleep.   Rides bike 25 miles a day 4 days a week. Switched from full suspension bike to road bike with no suspension. Much rougher. Riding that since May.   Will be out of town from next Monday through 22nd and will not be riding bike.   ROS- slight leg weakness several weeks ago when getting out of bed but no recurrence. No fecal incontinence. Urinary incontinence but no worsening.   Past Medical History- asthma, HTN, prostate cancer history, melanoma history, "bamboo spine" per patient-  Potentially ankylosing spondylitis  Medications- reviewed and updated, no medications  Objective: BP 130/72 mmHg  Pulse 54  Temp(Src) 98.2 F (36.8 C)  Wt 178 lb (80.74 kg) Gen: NAD, resting comfortably CV: RRR no murmurs rubs or gallops Lungs: CTAB no crackles, wheeze, rhonchi  Back - Normal skin, some tenderness to vertebral process palpation throughout spine.  Paraspinous muscles are not tender and without spasm.    Neuro- no saddle anesthesia, 5/5 strength lower extremities, 2+ reflexes at knee and ankle Intact monofilament exam bottom of both feet.   Assessment/Plan:  Tingling in bilateral feet Patient with history of "bamboo spine", potentially ankylosing spondylitis and new onset paresthesias for 4 days now in bilateral feet. No red flags on history or exam: Normal strength, reflexes, sensation in lower extremities. No recent flare of his back pain. I do wonder if  this could be originating from his back. We discussed ordering labs such as b12, TSH, a1c but I thought these would likely be low yield as first step. Discussed possibility of this coming from change in his biking habits but think this is unlikely.   Will discuss following options with Dr. Sarajane Jews when he returns tomorrow: 1. Consider burst of steroids such as 40mg  daily for 7 days (my preference) 2. Peripheral neuropathy workup- Consider imaging, nerve conduction study, labs 3. Refer back to ortho as first step 4. Neuro refer   Addendum 02/22/15: Discussed with Dr. Sarajane Jews and patient and we have jointly decided on prednisone burst for 1 week. Given no weakness, Dr. Sarajane Jews has lower suspicion for spinal stenosis cause but thinks reasonable to trial prednisone as first step. Return visit with Dr. Sarajane Jews after 22nd when he returns (will call to schedule today) then likely embark on peripheral neuropathy workup as next step if does not improve.

## 2015-02-22 MED ORDER — PREDNISONE 20 MG PO TABS: ORAL_TABLET | ORAL | Status: DC

## 2015-02-22 NOTE — Addendum Note (Signed)
Addended by: Marin Olp on: 02/22/2015 10:43 AM   Modules accepted: Orders

## 2015-03-18 DIAGNOSIS — N393 Stress incontinence (female) (male): Secondary | ICD-10-CM | POA: Diagnosis not present

## 2015-03-18 DIAGNOSIS — R278 Other lack of coordination: Secondary | ICD-10-CM | POA: Diagnosis not present

## 2015-03-18 DIAGNOSIS — C61 Malignant neoplasm of prostate: Secondary | ICD-10-CM | POA: Diagnosis not present

## 2015-03-18 DIAGNOSIS — M6281 Muscle weakness (generalized): Secondary | ICD-10-CM | POA: Diagnosis not present

## 2015-03-20 ENCOUNTER — Telehealth: Payer: Self-pay | Admitting: Medical Oncology

## 2015-03-20 NOTE — Telephone Encounter (Signed)
Oncology Nurse Navigator Documentation  Oncology Nurse Navigator Flowsheets 03/20/2015  Navigator Encounter Type Telephone;3 month- Spoke with [name] for 3 month follow up Prostate MDC. He states he has had 3 PSA's drawn since the clinic. The first 2 did not show cancer. He has an appointment with Dr. Alinda Money tomorrow and get the results of the 3rd one. He states he still has issues with  incontinence but he continues to work on this. He had his prostatectomy December 2015. I asked him to call me with any questions and or concerns. He voiced understanding.  Time Spent with Patient 15

## 2015-03-22 DIAGNOSIS — C61 Malignant neoplasm of prostate: Secondary | ICD-10-CM | POA: Diagnosis not present

## 2015-03-22 DIAGNOSIS — N529 Male erectile dysfunction, unspecified: Secondary | ICD-10-CM | POA: Diagnosis not present

## 2015-03-22 DIAGNOSIS — N393 Stress incontinence (female) (male): Secondary | ICD-10-CM | POA: Diagnosis not present

## 2015-04-23 DIAGNOSIS — M6281 Muscle weakness (generalized): Secondary | ICD-10-CM | POA: Diagnosis not present

## 2015-04-23 DIAGNOSIS — N393 Stress incontinence (female) (male): Secondary | ICD-10-CM | POA: Diagnosis not present

## 2015-04-23 DIAGNOSIS — R278 Other lack of coordination: Secondary | ICD-10-CM | POA: Diagnosis not present

## 2015-05-06 DIAGNOSIS — Z23 Encounter for immunization: Secondary | ICD-10-CM | POA: Diagnosis not present

## 2015-05-20 DIAGNOSIS — M6281 Muscle weakness (generalized): Secondary | ICD-10-CM | POA: Diagnosis not present

## 2015-05-20 DIAGNOSIS — R278 Other lack of coordination: Secondary | ICD-10-CM | POA: Diagnosis not present

## 2015-05-20 DIAGNOSIS — N393 Stress incontinence (female) (male): Secondary | ICD-10-CM | POA: Diagnosis not present

## 2015-07-01 DIAGNOSIS — N393 Stress incontinence (female) (male): Secondary | ICD-10-CM | POA: Diagnosis not present

## 2015-07-01 DIAGNOSIS — M6281 Muscle weakness (generalized): Secondary | ICD-10-CM | POA: Diagnosis not present

## 2015-07-01 DIAGNOSIS — R278 Other lack of coordination: Secondary | ICD-10-CM | POA: Diagnosis not present

## 2015-07-08 DIAGNOSIS — M6281 Muscle weakness (generalized): Secondary | ICD-10-CM | POA: Diagnosis not present

## 2015-07-08 DIAGNOSIS — C61 Malignant neoplasm of prostate: Secondary | ICD-10-CM | POA: Diagnosis not present

## 2015-08-01 DIAGNOSIS — C61 Malignant neoplasm of prostate: Secondary | ICD-10-CM | POA: Diagnosis not present

## 2015-08-05 DIAGNOSIS — N393 Stress incontinence (female) (male): Secondary | ICD-10-CM | POA: Diagnosis not present

## 2015-08-05 DIAGNOSIS — R278 Other lack of coordination: Secondary | ICD-10-CM | POA: Diagnosis not present

## 2015-08-05 DIAGNOSIS — M6281 Muscle weakness (generalized): Secondary | ICD-10-CM | POA: Diagnosis not present

## 2015-08-07 DIAGNOSIS — N5201 Erectile dysfunction due to arterial insufficiency: Secondary | ICD-10-CM | POA: Diagnosis not present

## 2015-08-07 DIAGNOSIS — N393 Stress incontinence (female) (male): Secondary | ICD-10-CM | POA: Diagnosis not present

## 2015-08-07 DIAGNOSIS — C61 Malignant neoplasm of prostate: Secondary | ICD-10-CM | POA: Diagnosis not present

## 2015-08-14 DIAGNOSIS — R350 Frequency of micturition: Secondary | ICD-10-CM | POA: Diagnosis not present

## 2015-08-14 DIAGNOSIS — R300 Dysuria: Secondary | ICD-10-CM | POA: Diagnosis not present

## 2015-08-20 DIAGNOSIS — Z85828 Personal history of other malignant neoplasm of skin: Secondary | ICD-10-CM | POA: Diagnosis not present

## 2015-08-20 DIAGNOSIS — D1801 Hemangioma of skin and subcutaneous tissue: Secondary | ICD-10-CM | POA: Diagnosis not present

## 2015-08-20 DIAGNOSIS — D0462 Carcinoma in situ of skin of left upper limb, including shoulder: Secondary | ICD-10-CM | POA: Diagnosis not present

## 2015-08-20 DIAGNOSIS — D485 Neoplasm of uncertain behavior of skin: Secondary | ICD-10-CM | POA: Diagnosis not present

## 2015-08-20 DIAGNOSIS — D2362 Other benign neoplasm of skin of left upper limb, including shoulder: Secondary | ICD-10-CM | POA: Diagnosis not present

## 2015-08-20 DIAGNOSIS — D0472 Carcinoma in situ of skin of left lower limb, including hip: Secondary | ICD-10-CM | POA: Diagnosis not present

## 2015-08-20 DIAGNOSIS — L821 Other seborrheic keratosis: Secondary | ICD-10-CM | POA: Diagnosis not present

## 2015-09-04 DIAGNOSIS — M6281 Muscle weakness (generalized): Secondary | ICD-10-CM | POA: Diagnosis not present

## 2015-09-04 DIAGNOSIS — N393 Stress incontinence (female) (male): Secondary | ICD-10-CM | POA: Diagnosis not present

## 2015-09-04 DIAGNOSIS — R278 Other lack of coordination: Secondary | ICD-10-CM | POA: Diagnosis not present

## 2015-09-12 DIAGNOSIS — H2513 Age-related nuclear cataract, bilateral: Secondary | ICD-10-CM | POA: Diagnosis not present

## 2015-10-02 DIAGNOSIS — M6281 Muscle weakness (generalized): Secondary | ICD-10-CM | POA: Diagnosis not present

## 2015-10-02 DIAGNOSIS — N393 Stress incontinence (female) (male): Secondary | ICD-10-CM | POA: Diagnosis not present

## 2015-10-02 DIAGNOSIS — R278 Other lack of coordination: Secondary | ICD-10-CM | POA: Diagnosis not present

## 2016-02-05 DIAGNOSIS — Z85828 Personal history of other malignant neoplasm of skin: Secondary | ICD-10-CM | POA: Diagnosis not present

## 2016-02-05 DIAGNOSIS — L738 Other specified follicular disorders: Secondary | ICD-10-CM | POA: Diagnosis not present

## 2016-02-21 DIAGNOSIS — C61 Malignant neoplasm of prostate: Secondary | ICD-10-CM | POA: Diagnosis not present

## 2016-03-12 DIAGNOSIS — Z8546 Personal history of malignant neoplasm of prostate: Secondary | ICD-10-CM | POA: Diagnosis not present

## 2016-03-12 DIAGNOSIS — N5201 Erectile dysfunction due to arterial insufficiency: Secondary | ICD-10-CM | POA: Diagnosis not present

## 2016-04-21 ENCOUNTER — Other Ambulatory Visit: Payer: Self-pay

## 2016-05-14 DIAGNOSIS — N390 Urinary tract infection, site not specified: Secondary | ICD-10-CM | POA: Diagnosis not present

## 2016-06-09 DIAGNOSIS — C44319 Basal cell carcinoma of skin of other parts of face: Secondary | ICD-10-CM | POA: Diagnosis not present

## 2016-06-09 DIAGNOSIS — L0109 Other impetigo: Secondary | ICD-10-CM | POA: Diagnosis not present

## 2016-06-09 DIAGNOSIS — Z85828 Personal history of other malignant neoplasm of skin: Secondary | ICD-10-CM | POA: Diagnosis not present

## 2016-06-21 DIAGNOSIS — Z79899 Other long term (current) drug therapy: Secondary | ICD-10-CM | POA: Diagnosis not present

## 2016-06-21 DIAGNOSIS — R3129 Other microscopic hematuria: Secondary | ICD-10-CM | POA: Diagnosis not present

## 2016-06-21 DIAGNOSIS — R399 Unspecified symptoms and signs involving the genitourinary system: Secondary | ICD-10-CM | POA: Diagnosis not present

## 2016-06-21 DIAGNOSIS — R300 Dysuria: Secondary | ICD-10-CM | POA: Diagnosis not present

## 2016-06-26 DIAGNOSIS — R300 Dysuria: Secondary | ICD-10-CM | POA: Diagnosis not present

## 2016-06-26 DIAGNOSIS — A499 Bacterial infection, unspecified: Secondary | ICD-10-CM | POA: Diagnosis not present

## 2016-06-26 DIAGNOSIS — N390 Urinary tract infection, site not specified: Secondary | ICD-10-CM | POA: Diagnosis not present

## 2016-08-06 DIAGNOSIS — Z23 Encounter for immunization: Secondary | ICD-10-CM | POA: Diagnosis not present

## 2016-08-19 DIAGNOSIS — L821 Other seborrheic keratosis: Secondary | ICD-10-CM | POA: Diagnosis not present

## 2016-08-19 DIAGNOSIS — D485 Neoplasm of uncertain behavior of skin: Secondary | ICD-10-CM | POA: Diagnosis not present

## 2016-08-19 DIAGNOSIS — D1801 Hemangioma of skin and subcutaneous tissue: Secondary | ICD-10-CM | POA: Diagnosis not present

## 2016-08-19 DIAGNOSIS — Z85828 Personal history of other malignant neoplasm of skin: Secondary | ICD-10-CM | POA: Diagnosis not present

## 2016-08-19 DIAGNOSIS — D225 Melanocytic nevi of trunk: Secondary | ICD-10-CM | POA: Diagnosis not present

## 2016-08-19 DIAGNOSIS — L820 Inflamed seborrheic keratosis: Secondary | ICD-10-CM | POA: Diagnosis not present

## 2016-08-21 DIAGNOSIS — R3129 Other microscopic hematuria: Secondary | ICD-10-CM | POA: Diagnosis not present

## 2016-08-21 DIAGNOSIS — N390 Urinary tract infection, site not specified: Secondary | ICD-10-CM | POA: Diagnosis not present

## 2016-08-28 DIAGNOSIS — R300 Dysuria: Secondary | ICD-10-CM | POA: Diagnosis not present

## 2016-08-28 DIAGNOSIS — N390 Urinary tract infection, site not specified: Secondary | ICD-10-CM | POA: Diagnosis not present

## 2016-08-31 DIAGNOSIS — Z8546 Personal history of malignant neoplasm of prostate: Secondary | ICD-10-CM | POA: Diagnosis not present

## 2016-09-03 IMAGING — CR DG THORACIC SPINE 2V
3 series · 3 of 3 positions shown · non-contrast
Comparison: Report from 04/30/2006

CLINICAL DATA: Chronic back pain.  Arthritis.

EXAM:
THORACIC SPINE - 2 VIEW

[view not recorded (1 of 3)]
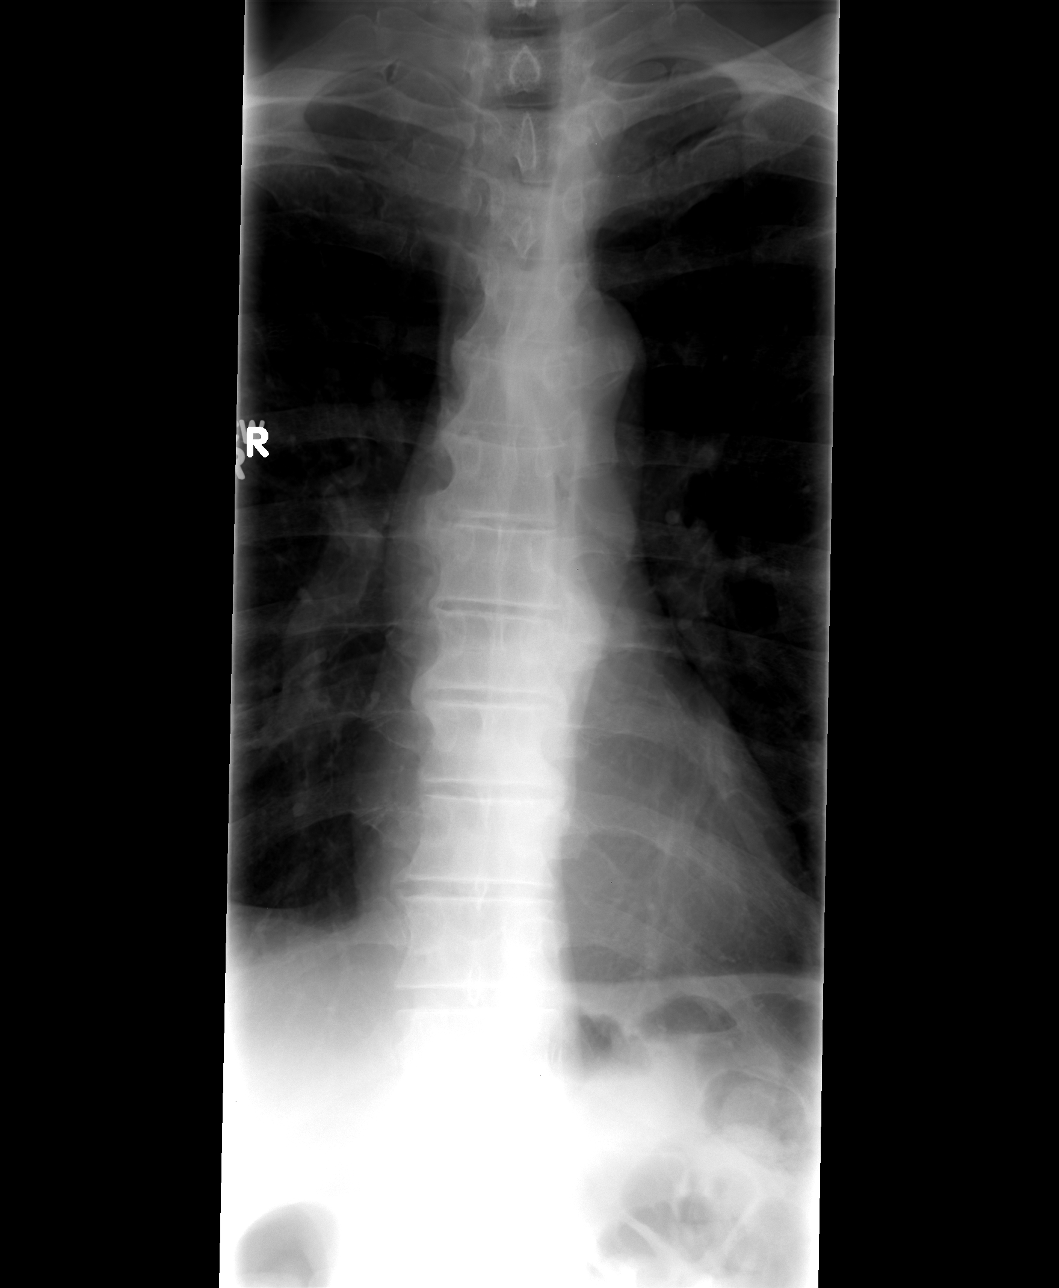

[view not recorded (2 of 3)]
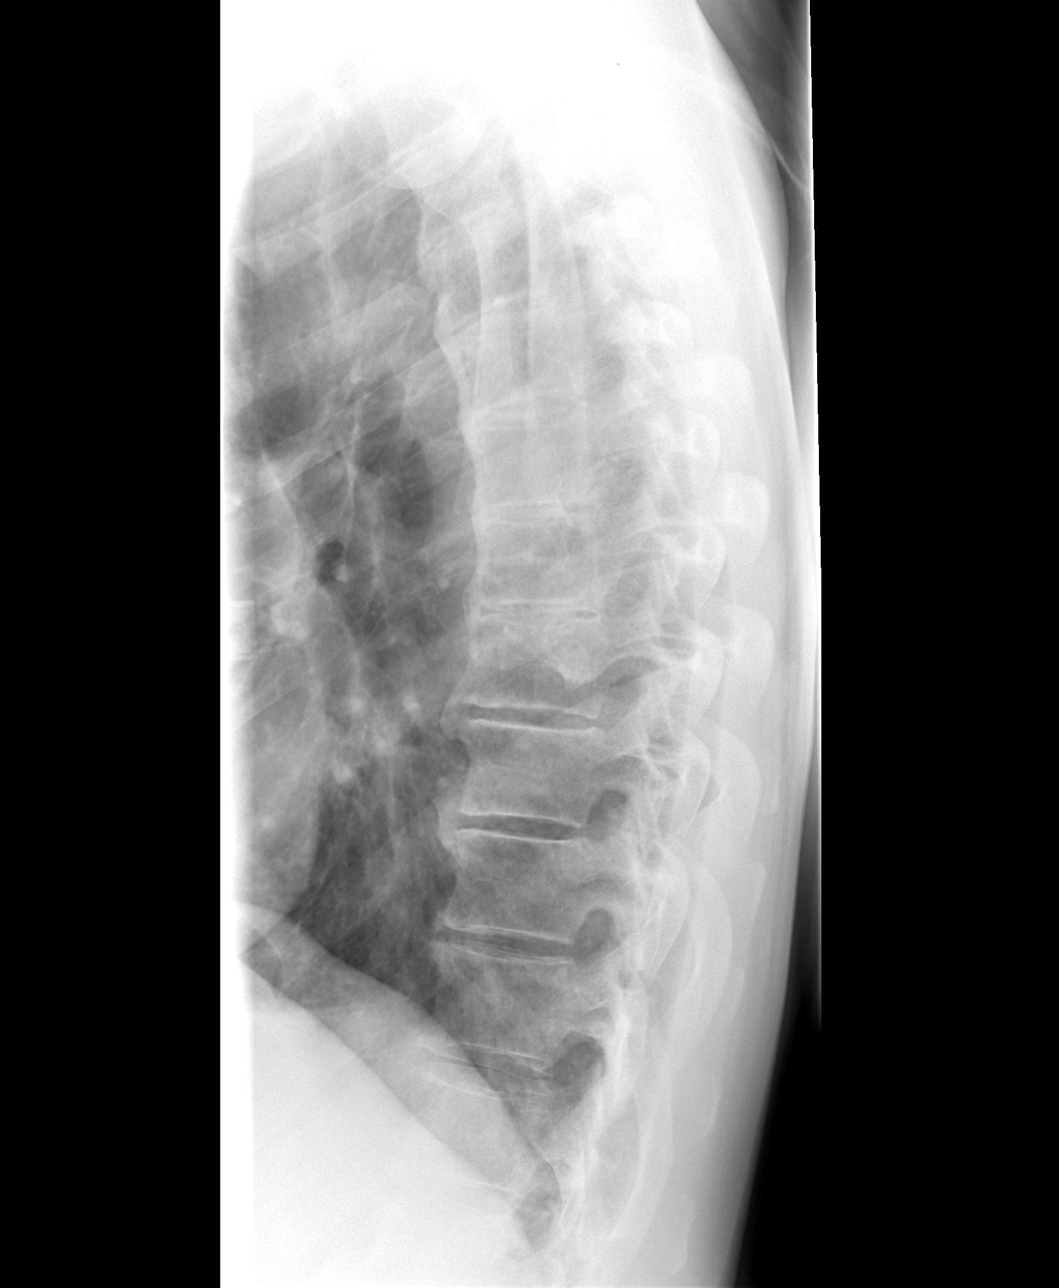

[view not recorded (3 of 3)]
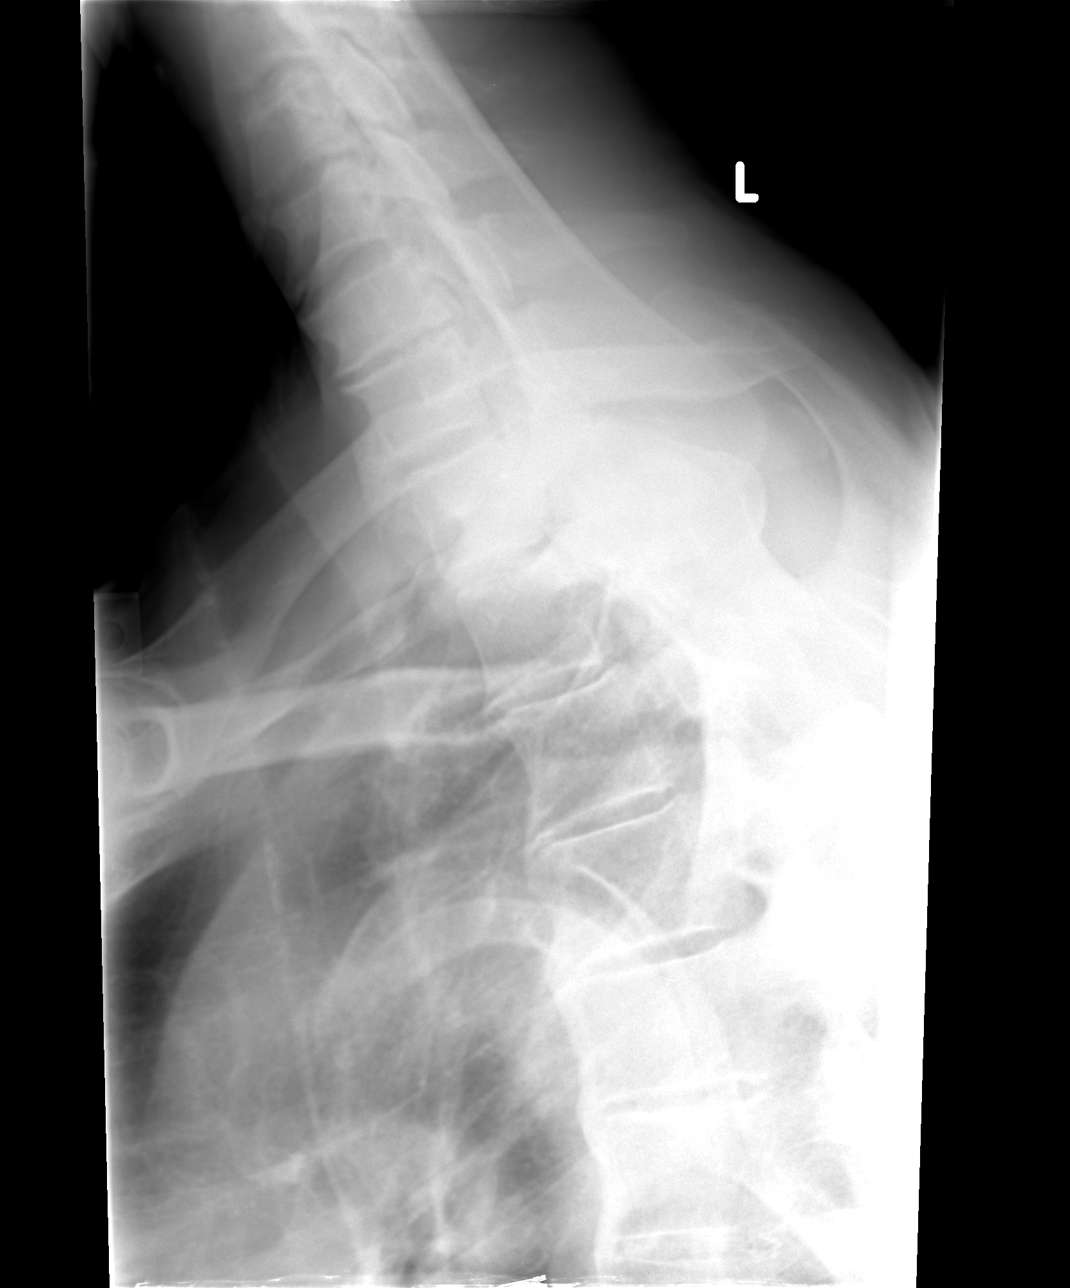

[3 of 3 positions shown; findings below may reference images not displayed]

FINDINGS: Bridging spurring in the thoracic spine favoring diffuse idiopathic
skeletal hyperostoses. Mild intervertebral disc space narrowing in
the upper half of the thoracic spine.

No malalignment.  No fracture observed.
IMPRESSION: 1. Extensive osteophyte formation in the thoracic spine suggesting
diffuse idiopathic skeletal hyperostosis. There is only mild
intervertebral disc space loss in the upper half of the thoracic
spine.

## 2016-09-14 DIAGNOSIS — H43813 Vitreous degeneration, bilateral: Secondary | ICD-10-CM | POA: Diagnosis not present

## 2016-09-30 DIAGNOSIS — Z8546 Personal history of malignant neoplasm of prostate: Secondary | ICD-10-CM | POA: Diagnosis not present

## 2016-09-30 DIAGNOSIS — N393 Stress incontinence (female) (male): Secondary | ICD-10-CM | POA: Diagnosis not present

## 2016-09-30 DIAGNOSIS — N5201 Erectile dysfunction due to arterial insufficiency: Secondary | ICD-10-CM | POA: Diagnosis not present

## 2016-10-27 IMAGING — CR DG CHEST 2V
2 series · 2 of 2 positions shown · non-contrast
Comparison: 06/06/2014

CLINICAL DATA: Preop for prostatectomy

EXAM:
CHEST  2 VIEW

[w chest pa]
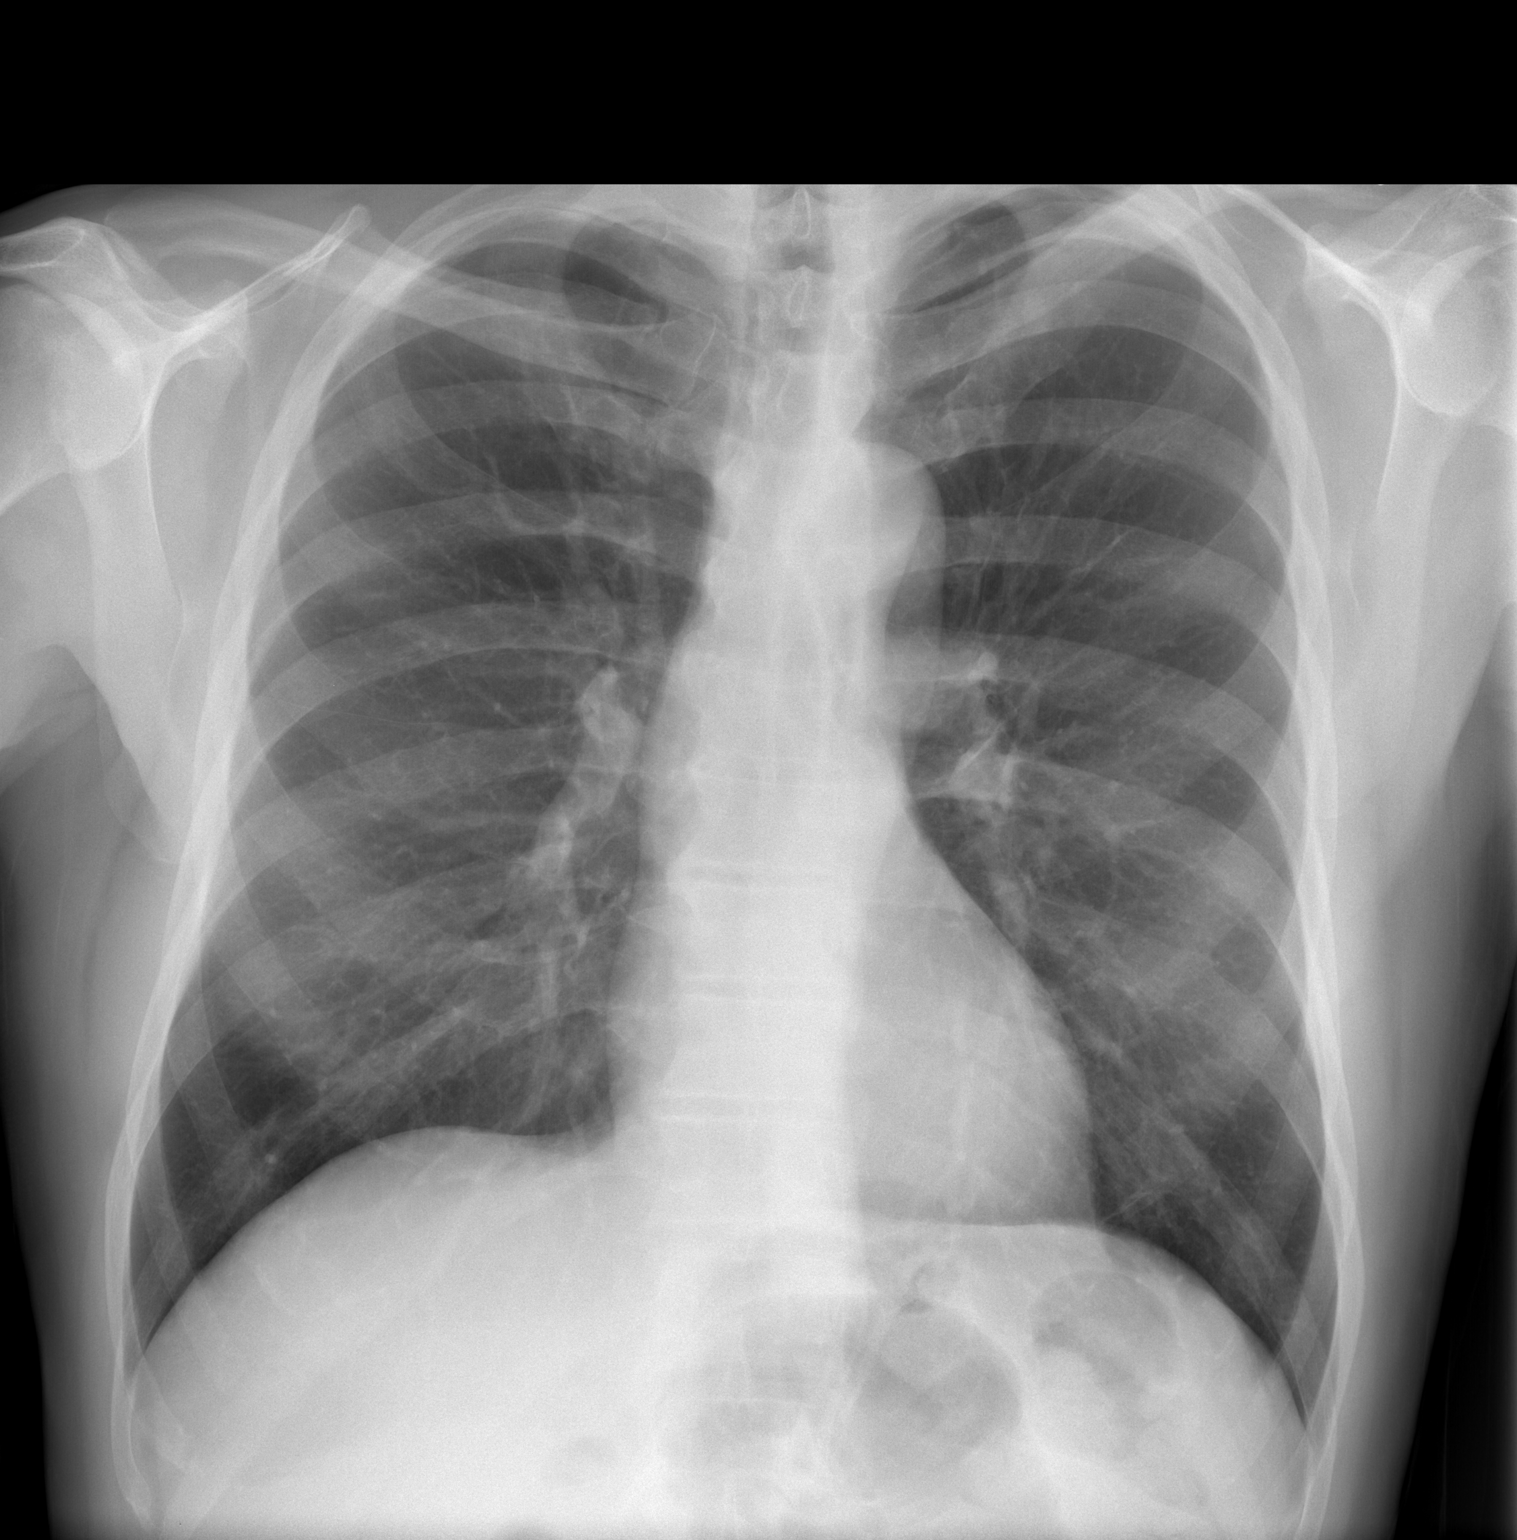

[w chest lat]
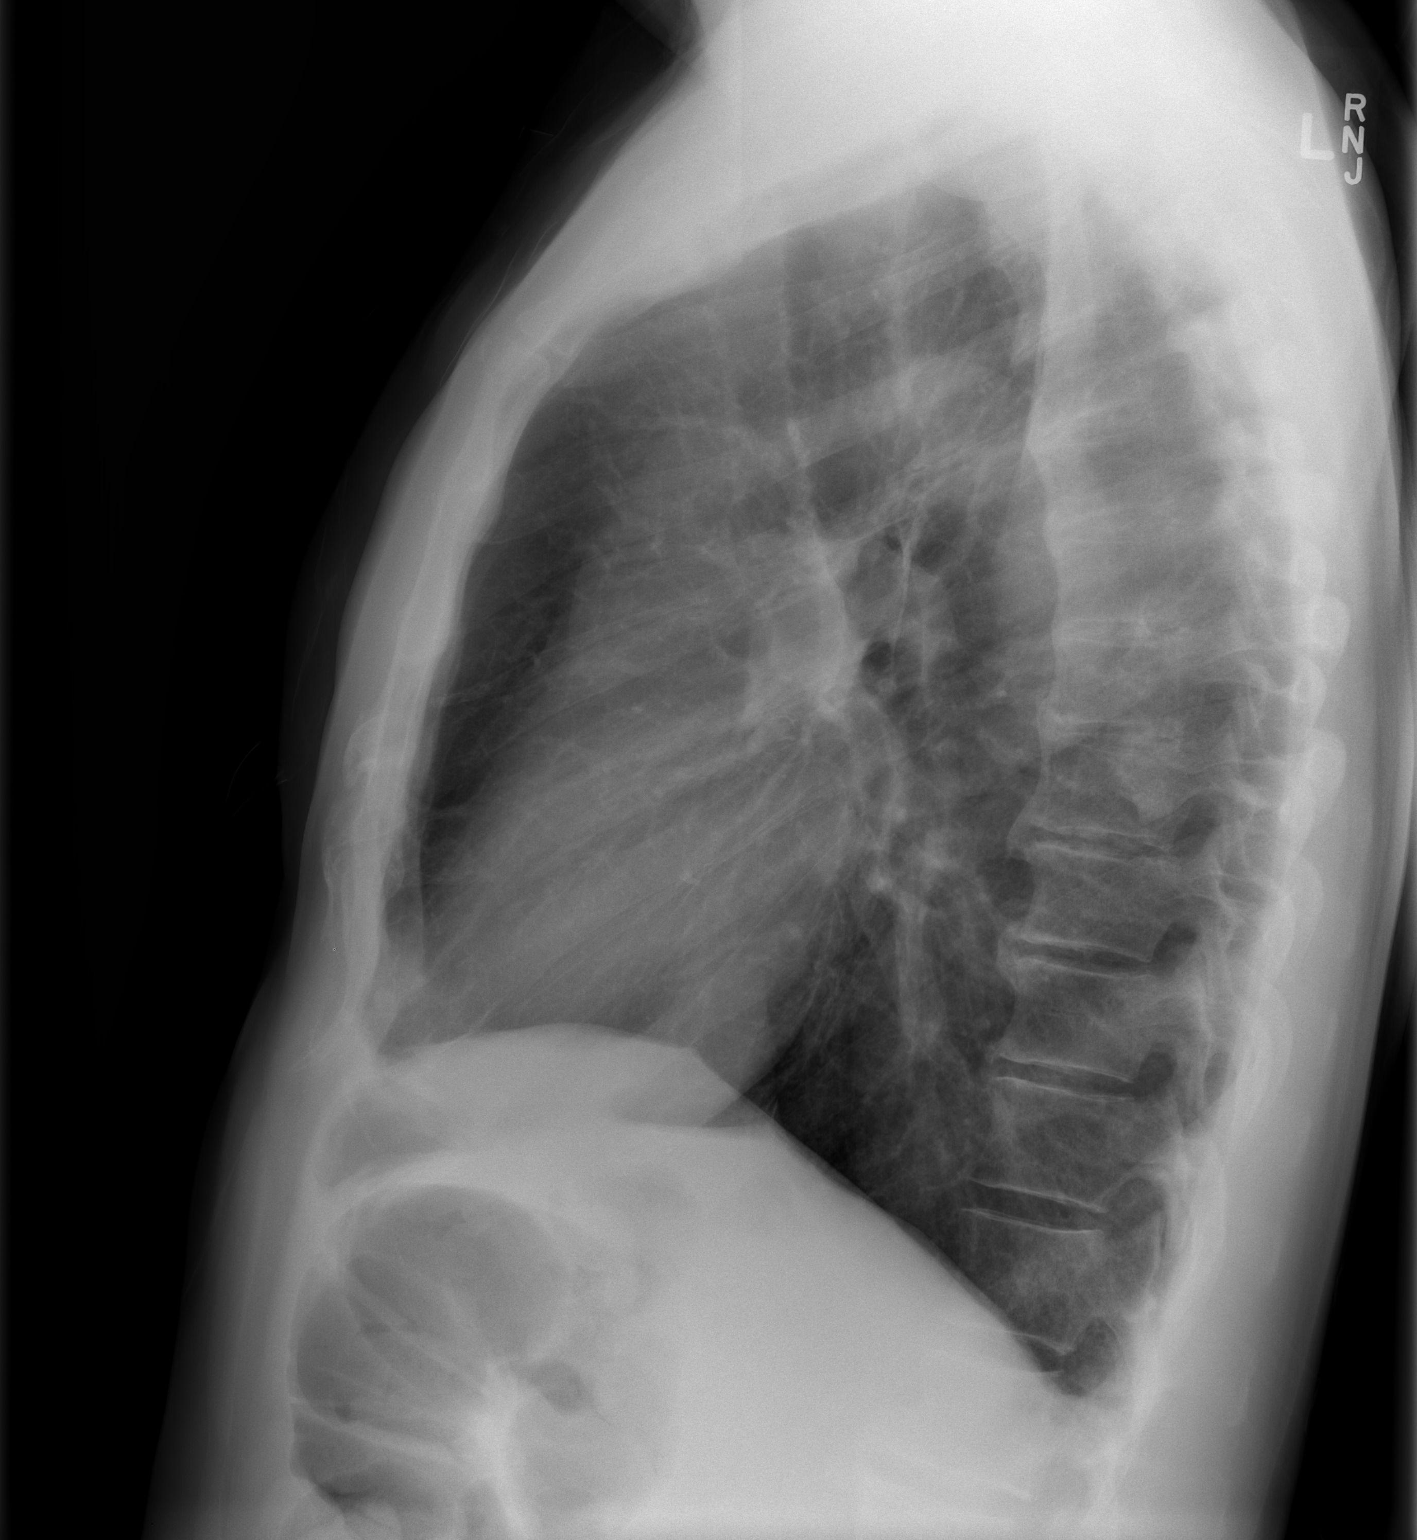

[2 of 2 positions shown; findings below may reference images not displayed]

FINDINGS: Cardiomediastinal silhouette is unremarkable. No acute infiltrate or
pleural effusion. No pulmonary edema. Stable degenerative changes
thoracic spine.
IMPRESSION: No active disease.  Stable degenerative changes thoracic spine.

## 2017-02-26 DIAGNOSIS — R300 Dysuria: Secondary | ICD-10-CM | POA: Diagnosis not present

## 2017-03-10 DIAGNOSIS — R300 Dysuria: Secondary | ICD-10-CM | POA: Diagnosis not present

## 2017-03-10 DIAGNOSIS — N390 Urinary tract infection, site not specified: Secondary | ICD-10-CM | POA: Diagnosis not present

## 2017-04-20 DIAGNOSIS — Z8546 Personal history of malignant neoplasm of prostate: Secondary | ICD-10-CM | POA: Diagnosis not present

## 2017-04-27 DIAGNOSIS — N393 Stress incontinence (female) (male): Secondary | ICD-10-CM | POA: Diagnosis not present

## 2017-04-27 DIAGNOSIS — Z8546 Personal history of malignant neoplasm of prostate: Secondary | ICD-10-CM | POA: Diagnosis not present

## 2017-04-27 DIAGNOSIS — K429 Umbilical hernia without obstruction or gangrene: Secondary | ICD-10-CM | POA: Diagnosis not present

## 2017-04-27 DIAGNOSIS — N5201 Erectile dysfunction due to arterial insufficiency: Secondary | ICD-10-CM | POA: Diagnosis not present

## 2017-05-13 ENCOUNTER — Encounter: Payer: Self-pay | Admitting: Family Medicine

## 2017-05-29 DIAGNOSIS — J069 Acute upper respiratory infection, unspecified: Secondary | ICD-10-CM | POA: Diagnosis not present

## 2017-06-15 DIAGNOSIS — Z23 Encounter for immunization: Secondary | ICD-10-CM | POA: Diagnosis not present

## 2017-07-11 DIAGNOSIS — Z23 Encounter for immunization: Secondary | ICD-10-CM | POA: Diagnosis not present

## 2017-09-28 ENCOUNTER — Encounter (HOSPITAL_COMMUNITY): Payer: Self-pay | Admitting: Emergency Medicine

## 2017-09-28 DIAGNOSIS — I10 Essential (primary) hypertension: Secondary | ICD-10-CM | POA: Diagnosis not present

## 2017-09-28 DIAGNOSIS — Z5321 Procedure and treatment not carried out due to patient leaving prior to being seen by health care provider: Secondary | ICD-10-CM | POA: Insufficient documentation

## 2017-09-28 LAB — I-STAT TROPONIN, ED: Troponin i, poc: 0 ng/mL (ref 0.00–0.08)

## 2017-09-28 LAB — CBC
HEMATOCRIT: 39.9 % (ref 39.0–52.0)
HEMOGLOBIN: 14.2 g/dL (ref 13.0–17.0)
MCH: 31.8 pg (ref 26.0–34.0)
MCHC: 35.6 g/dL (ref 30.0–36.0)
MCV: 89.5 fL (ref 78.0–100.0)
Platelets: 369 10*3/uL (ref 150–400)
RBC: 4.46 MIL/uL (ref 4.22–5.81)
RDW: 11.7 % (ref 11.5–15.5)
WBC: 6.7 10*3/uL (ref 4.0–10.5)

## 2017-09-28 LAB — BASIC METABOLIC PANEL
Anion gap: 8 (ref 5–15)
BUN: 15 mg/dL (ref 6–20)
CO2: 29 mmol/L (ref 22–32)
Calcium: 9.4 mg/dL (ref 8.9–10.3)
Chloride: 96 mmol/L — ABNORMAL LOW (ref 101–111)
Creatinine, Ser: 0.88 mg/dL (ref 0.61–1.24)
GFR calc Af Amer: >60 mL/min (ref >60)
Glucose, Bld: 109 mg/dL — ABNORMAL HIGH (ref 65–99)
POTASSIUM: 3.8 mmol/L (ref 3.5–5.1)
Sodium: 133 mmol/L — ABNORMAL LOW (ref 135–145)

## 2017-09-28 NOTE — ED Triage Notes (Signed)
Patient presents with sigficant other c/o hypertension onset of Sunday morning. Pt went to [facility] that day had EKG and lab work, but gets results tomorrow. Pt was started on HTZ. Pt had headache along with hypertension and has been effective with tylenol. Denies any balance issues, N/V/D or difficulty breathing.

## 2017-09-29 ENCOUNTER — Emergency Department (HOSPITAL_COMMUNITY)
Admission: EM | Admit: 2017-09-29 | Discharge: 2017-09-29 | Discharge status: Against Medical Advice | Payer: Medicare Other | Attending: Emergency Medicine | Admitting: Emergency Medicine

## 2017-09-29 NOTE — ED Notes (Signed)
Registration reports that patient was leaving AMA

## 2017-09-29 NOTE — ED Notes (Signed)
Called patient, no answer 

## 2017-09-30 ENCOUNTER — Ambulatory Visit: Payer: Self-pay | Admitting: *Deleted

## 2017-09-30 NOTE — Telephone Encounter (Signed)
Called in c/o blood pressure being high.   See triage notes for readings. He went to [facility] on Sunday for c/o high BP.  He was started on HCTZ and Nifedipine ER 30 mg.   He then went to [facility] ED for c/o high BP and left without being seen after waiting from 8:00 PM until 8:00am and still had not been seen.   He left.  He wanted to see Dr. Sarajane Jews.   I just want to see a doctor that knows what they are doing.   Something is not right about my BP.    I made an appt with Dr. Sarajane Jews for 10/01/17 at 2:45.  He was satisfied with this.       Reason for Disposition . [7] Systolic BP  >= 262 OR Diastolic >= 90 AND [0] taking BP medications  Answer Assessment - Initial Assessment Questions 1. BLOOD PRESSURE: "What is the blood pressure?" "Did you take at least two measurements 5 minutes apart?"     Yesterday afternoon was 132/83;  This morning 140/86; 152/90 now and your ears are ringing. 2. ONSET: "When did you take your blood pressure?"     The 152/90 just now 3. HOW: "How did you obtain the blood pressure?" (e.g., visiting nurse, automatic home BP monitor)     You have your own BP machine at home 4. HISTORY: "Do you have a history of high blood pressure?"     Yes   Dr. Sarajane Jews years ago.   Not been on BP medications. 5. MEDICATIONS: "Are you taking any medications for blood pressure?" "Have you missed any doses recently?"     No 6. OTHER SYMPTOMS: "Do you have any symptoms?" (e.g., headache, chest pain, blurred vision, difficulty breathing, weakness)     Ears ringing when high, yesterday I had a migraine.  I don't feel right.    7. PREGNANCY: "Is there any chance you are pregnant?" "When was your last menstrual period?"     N/A  Protocols used: HIGH BLOOD PRESSURE-A-AH

## 2017-10-01 ENCOUNTER — Ambulatory Visit (INDEPENDENT_AMBULATORY_CARE_PROVIDER_SITE_OTHER): Payer: Medicare Other | Admitting: Family Medicine

## 2017-10-01 ENCOUNTER — Encounter: Payer: Self-pay | Admitting: Family Medicine

## 2017-10-01 VITALS — BP 130/76 | HR 75 | Temp 97.8°F | Wt 180.2 lb

## 2017-10-01 DIAGNOSIS — I10 Essential (primary) hypertension: Secondary | ICD-10-CM | POA: Diagnosis not present

## 2017-10-01 DIAGNOSIS — R0609 Other forms of dyspnea: Secondary | ICD-10-CM | POA: Diagnosis not present

## 2017-10-01 MED ORDER — METOPROLOL SUCCINATE ER 50 MG PO TB24: 50.0000 mg | ORAL_TABLET | Freq: Every day | ORAL | 3 refills | Status: DC

## 2017-10-01 NOTE — Progress Notes (Signed)
   Subjective:    Patient ID: [name], male    DOB: 1946/04/23, 72 y.o.   MRN: [id number]  HPI Here with concerns about his heart and about high BP. He began to feel sluggish and had some mild SOB and some intermittent tingling down the left arm about a week ago. He went to the [facility] walk in clinic on 09-26-17 and his BP was 152/96 after he had gotten a reading of 172/96 at home. At the clinic he had a normal EKG and some labs drawn, which included a normal BMET and a normal Troponin level. He was started on HCTZ 12.5 mg daily. Then he went back to that clinic on 09-29-17 and had a BP of 146/88 after getting a reading of 170/99 at home. At that point he was also started on Nifedipine ER 30 mg daly. Since then he has felt a little better and his BP has stabilized in the 140s over 80s. He still worries about his heart however and he asks to be referred to a Cardiologist. He also does not want to take either of the medications he is on due to perceived side effects.   Review of Systems  Constitutional: Negative.   Respiratory: Negative.   Cardiovascular: Negative.   Neurological: Negative.        Objective:   Physical Exam  Constitutional: He is oriented to person, place, and time. He appears well-developed and well-nourished.  Neck: No thyromegaly present.  Cardiovascular: Normal rate, regular rhythm, normal heart sounds and intact distal pulses.  Pulmonary/Chest: Effort normal and breath sounds normal. No respiratory distress. He has no wheezes. He has no rales.  Musculoskeletal: He exhibits no edema.  Lymphadenopathy:    He has no cervical adenopathy.  Neurological: He is alert and oriented to person, place, and time.          Assessment & Plan:  HTN, which is partially controlled. We will stop the HCTZ and Nifedipine, and instead he will start on Metoprolol succinate 50 mg daily. We will arrange an ECHO soon, and he will refer him to Cardiology (he wants to see Dr. Daneen Schick).    Alysia Penna, MD

## 2017-10-08 ENCOUNTER — Other Ambulatory Visit (HOSPITAL_COMMUNITY): Payer: Medicare Other

## 2017-10-08 ENCOUNTER — Other Ambulatory Visit: Payer: Self-pay

## 2017-10-08 ENCOUNTER — Ambulatory Visit (HOSPITAL_COMMUNITY): Payer: Medicare Other | Attending: Cardiology

## 2017-10-08 DIAGNOSIS — I119 Hypertensive heart disease without heart failure: Secondary | ICD-10-CM | POA: Diagnosis not present

## 2017-10-08 DIAGNOSIS — Z8249 Family history of ischemic heart disease and other diseases of the circulatory system: Secondary | ICD-10-CM | POA: Insufficient documentation

## 2017-10-08 DIAGNOSIS — R0609 Other forms of dyspnea: Secondary | ICD-10-CM | POA: Diagnosis not present

## 2017-10-12 ENCOUNTER — Encounter: Payer: Self-pay | Admitting: Family Medicine

## 2017-10-13 ENCOUNTER — Ambulatory Visit (INDEPENDENT_AMBULATORY_CARE_PROVIDER_SITE_OTHER): Payer: Medicare Other | Admitting: Family Medicine

## 2017-10-13 ENCOUNTER — Encounter: Payer: Self-pay | Admitting: Family Medicine

## 2017-10-13 VITALS — BP 120/68 | HR 54 | Temp 97.6°F | Wt 182.0 lb

## 2017-10-13 DIAGNOSIS — I10 Essential (primary) hypertension: Secondary | ICD-10-CM | POA: Diagnosis not present

## 2017-10-13 DIAGNOSIS — Z8249 Family history of ischemic heart disease and other diseases of the circulatory system: Secondary | ICD-10-CM | POA: Diagnosis not present

## 2017-10-13 NOTE — Telephone Encounter (Signed)
Noted  

## 2017-10-13 NOTE — Progress Notes (Signed)
   Subjective:    Patient ID: [name], male    DOB: 1946/07/13, 72 y.o.   MRN: [id number]  HPI Here to follow up on HTN and other issues. At our last visit we stopped his Nifedipine and HCTZ and started him on Metoprolol succinate. He feel much better now and has no complaints at all. The tingling in the arms has stopped. His BP readings at home are stable in the range of 120s over 70s. He had an ECHO that was completely normal. He does ask about any risk he may have for an aortic aneurysm. His father had an aneurysm that was surgically repaired. His father was also a smoker with HTN, and he had a stroke late in life.    Review of Systems  Constitutional: Negative.   Respiratory: Negative.   Cardiovascular: Negative.   Neurological: Negative.        Objective:   Physical Exam  Constitutional: He is oriented to person, place, and time. He appears well-developed and well-nourished.  Neck: No thyromegaly present.  Cardiovascular: Normal rate, regular rhythm, normal heart sounds and intact distal pulses.  Pulmonary/Chest: Effort normal and breath sounds normal. No respiratory distress. He has no wheezes. He has no rales.  Lymphadenopathy:    He has no cervical adenopathy.  Neurological: He is alert and oriented to person, place, and time.          Assessment & Plan:  HTN, now well controlled. We will stay on the current regimen. We will set him up for an aortic US soon.  Alysia Penna, MD

## 2017-10-19 ENCOUNTER — Telehealth: Payer: Self-pay | Admitting: Family Medicine

## 2017-10-19 ENCOUNTER — Encounter: Payer: Self-pay | Admitting: Family Medicine

## 2017-10-19 DIAGNOSIS — Z1211 Encounter for screening for malignant neoplasm of colon: Secondary | ICD-10-CM

## 2017-10-19 DIAGNOSIS — H9190 Unspecified hearing loss, unspecified ear: Secondary | ICD-10-CM

## 2017-10-19 NOTE — Telephone Encounter (Signed)
Copied from [place]. Topic: Quick Communication - See Telephone Encounter >> Oct 19, 2017  4:45 PM Cleaster Corin, NT wrote: CRM for notification. See Telephone encounter for:   10/19/17. Pt. Calling to see if Dr. Sarajane Jews has set up anything to have an ultra sound done. Pt. Would like for someone to give him a call back.

## 2017-10-19 NOTE — Telephone Encounter (Signed)
Pt wanted to know if Dr. Sarajane Jews set up what was needed to test to see if he had an aneurysm. Echocardiogram was done 2/15. Sent to PCP

## 2017-10-19 NOTE — Telephone Encounter (Signed)
The referral coordinator is working on this, will follow up again tomorrow, then call patient.

## 2017-10-20 NOTE — Telephone Encounter (Signed)
Referral coordinator is calling pt now to set up this test, Dr. Sarajane Jews had already ordered.

## 2017-10-20 NOTE — Telephone Encounter (Signed)
Yes, Cologuard is the one we use. Please set him up for this

## 2017-10-21 ENCOUNTER — Ambulatory Visit (HOSPITAL_COMMUNITY)
Admission: RE | Admit: 2017-10-21 | Discharge: 2017-10-21 | Disposition: A | Discharge status: Home / Self Care | Payer: Medicare Other | Source: Ambulatory Visit | Attending: Cardiovascular Disease | Admitting: Cardiovascular Disease

## 2017-10-21 DIAGNOSIS — Z8249 Family history of ischemic heart disease and other diseases of the circulatory system: Secondary | ICD-10-CM | POA: Diagnosis present

## 2017-10-21 DIAGNOSIS — R9389 Abnormal findings on diagnostic imaging of other specified body structures: Secondary | ICD-10-CM | POA: Insufficient documentation

## 2017-10-21 DIAGNOSIS — I10 Essential (primary) hypertension: Secondary | ICD-10-CM | POA: Insufficient documentation

## 2017-10-21 NOTE — Telephone Encounter (Signed)
Cologuard order faxed to [phone]. Fax confirmation received.

## 2017-10-22 NOTE — Telephone Encounter (Signed)
I did a referral to ENT

## 2017-10-25 ENCOUNTER — Telehealth: Payer: Self-pay | Admitting: Family Medicine

## 2017-10-25 NOTE — Telephone Encounter (Signed)
Copied from [place] [phone]. Topic: Quick Communication - See Telephone Encounter >> Oct 25, 2017  2:39 PM Ether Griffins B wrote: CRM for notification. See Telephone encounter for:  Pt has some questions about abdominal aorta study that was done.  10/25/17.

## 2017-10-26 ENCOUNTER — Encounter: Payer: Self-pay | Admitting: Family Medicine

## 2017-10-26 ENCOUNTER — Telehealth: Payer: Self-pay | Admitting: Family Medicine

## 2017-10-26 NOTE — Telephone Encounter (Signed)
Prescriber not at this practice.

## 2017-10-26 NOTE — Telephone Encounter (Signed)
Copied from [place] [phone]. Topic: Quick Communication - Other Results >> Oct 26, 2017  4:39 PM Cecelia Byars, NT wrote: Patient called back to have someone explain his results from having abdominal aorta echocardiogram done please advise

## 2017-10-26 NOTE — Telephone Encounter (Signed)
It was normal. No aneurysms, no dilatations

## 2017-10-26 NOTE — Telephone Encounter (Signed)
Sent to PCP ?

## 2017-10-27 NOTE — Telephone Encounter (Signed)
Called and spoke with pt. Pt advised and seem to have a few concerns that her had based off ready the results. Pt has set up an appt. to discuss concerns with Dr. Sarajane Jews.

## 2017-10-28 ENCOUNTER — Ambulatory Visit (INDEPENDENT_AMBULATORY_CARE_PROVIDER_SITE_OTHER): Payer: Medicare Other | Admitting: Family Medicine

## 2017-10-28 ENCOUNTER — Encounter: Payer: Self-pay | Admitting: Family Medicine

## 2017-10-28 VITALS — BP 108/60 | HR 56 | Temp 97.7°F | Wt 180.8 lb

## 2017-10-28 DIAGNOSIS — I77819 Aortic ectasia, unspecified site: Secondary | ICD-10-CM | POA: Insufficient documentation

## 2017-10-28 DIAGNOSIS — I10 Essential (primary) hypertension: Secondary | ICD-10-CM

## 2017-10-28 DIAGNOSIS — Z8249 Family history of ischemic heart disease and other diseases of the circulatory system: Secondary | ICD-10-CM | POA: Diagnosis not present

## 2017-10-28 DIAGNOSIS — E785 Hyperlipidemia, unspecified: Secondary | ICD-10-CM | POA: Diagnosis not present

## 2017-10-28 MED ORDER — ATORVASTATIN CALCIUM 20 MG PO TABS: 20.0000 mg | ORAL_TABLET | Freq: Every day | ORAL | 3 refills | Status: DC

## 2017-10-28 NOTE — Progress Notes (Signed)
   Subjective:    Patient ID: [name], male    DOB: July 10, 1946, 72 y.o.   MRN: [id number]  HPI Here to discuss factors affecting his cardiac risk. He had labs drawn on 09-26-17 which showed a normal TG, a high HDL at  66, and a high LDL at 137. He tires to watch his diet closely. He also recently had an aortic AAA screen and this showed a slight dilation in the proximal vessel with a diameter of 3.1 cm. It also showed some mild atherosclerotic buildups. Caesar wants to be aggressive and reduce his risk as much as possible.   Review of Systems  Constitutional: Negative.   Respiratory: Negative.   Cardiovascular: Negative.   Neurological: Negative.        Objective:   Physical Exam  Constitutional: He is oriented to person, place, and time. He appears well-developed and well-nourished.  Cardiovascular: Normal rate, regular rhythm, normal heart sounds and intact distal pulses.  Pulmonary/Chest: Effort normal and breath sounds normal. No respiratory distress. He has no wheezes. He has no rales.  Neurological: He is alert and oriented to person, place, and time.          Assessment & Plan:  For the aortic dilation, we agreed to get another scan in 2 years to assess the stability of the vessel walls. For the high cholesterol, we agreed to start him on Lipitor 20 mg daily. We will repeat fasting labs in 90 days.  Alysia Penna, MD

## 2017-11-09 ENCOUNTER — Encounter: Payer: Self-pay | Admitting: Family Medicine

## 2017-11-09 LAB — COLOGUARD: Cologuard: NEGATIVE

## 2017-11-22 ENCOUNTER — Ambulatory Visit: Payer: Medicare Other | Admitting: Interventional Cardiology

## 2017-12-13 ENCOUNTER — Encounter: Payer: Self-pay | Admitting: Family Medicine

## 2018-01-21 ENCOUNTER — Other Ambulatory Visit: Payer: Self-pay | Admitting: Family Medicine

## 2018-01-21 ENCOUNTER — Encounter: Payer: Self-pay | Admitting: Family Medicine

## 2018-01-21 ENCOUNTER — Ambulatory Visit (INDEPENDENT_AMBULATORY_CARE_PROVIDER_SITE_OTHER): Payer: Medicare Other | Admitting: Family Medicine

## 2018-01-21 VITALS — BP 100/68 | HR 68 | Temp 97.8°F | Ht 73.0 in | Wt 187.4 lb

## 2018-01-21 DIAGNOSIS — J019 Acute sinusitis, unspecified: Secondary | ICD-10-CM

## 2018-01-21 MED ORDER — AZITHROMYCIN 250 MG PO TABS: ORAL_TABLET | ORAL | 0 refills | Status: DC

## 2018-01-21 NOTE — Telephone Encounter (Signed)
Patient is calling about this being called in. Please advise.

## 2018-01-21 NOTE — Telephone Encounter (Signed)
Pt came back up front and stated that he does need that Z-pac for his sinus infection.    [facility] on [address]

## 2018-01-21 NOTE — Telephone Encounter (Signed)
Sent to PCP to advise 

## 2018-01-21 NOTE — Telephone Encounter (Signed)
Per today's office note: Sinusitis, treat with a Zpack.  Zpack filled to pharmacy as requested.

## 2018-01-21 NOTE — Progress Notes (Signed)
   Subjective:    Patient ID: [name], male    DOB: December 22, 1945, 72 y.o.   MRN: [id number]  HPI Here for one week of sinus pressure, PND, and a dry cough. On Mucinex DM.    Review of Systems  Constitutional: Negative.   HENT: Positive for congestion, postnasal drip, sinus pressure and sinus pain. Negative for sore throat.   Eyes: Negative.   Respiratory: Positive for cough.        Objective:   Physical Exam  Constitutional: He appears well-developed and well-nourished.  HENT:  Right Ear: External ear normal.  Left Ear: External ear normal.  Nose: Nose normal.  Mouth/Throat: Oropharynx is clear and moist.  Eyes: Conjunctivae are normal.  Neck: No thyromegaly present.  Pulmonary/Chest: Effort normal and breath sounds normal. No stridor. No respiratory distress. He has no wheezes. He has no rales.  Lymphadenopathy:    He has no cervical adenopathy.          Assessment & Plan:  Sinusitis, treat with a Zpack.  Alysia Penna, MD

## 2018-01-25 ENCOUNTER — Encounter: Payer: Self-pay | Admitting: Family Medicine

## 2018-01-26 NOTE — Telephone Encounter (Signed)
Copied from [place] [id number]. Topic: Quick Communication - See Telephone Encounter >> Jan 26, 2018  8:30 AM Ahmed Prima L wrote: CRM for notification. See Telephone encounter for: 01/26/18.  Patient is requesting to have his cholesterol checked. Please advise [phone]

## 2018-01-27 ENCOUNTER — Encounter: Payer: Self-pay | Admitting: Family Medicine

## 2018-01-28 ENCOUNTER — Other Ambulatory Visit: Payer: Self-pay

## 2018-01-28 DIAGNOSIS — E785 Hyperlipidemia, unspecified: Secondary | ICD-10-CM

## 2018-01-28 MED ORDER — METOPROLOL SUCCINATE ER 50 MG PO TB24: 50.0000 mg | ORAL_TABLET | Freq: Every day | ORAL | 11 refills | Status: DC

## 2018-01-28 NOTE — Telephone Encounter (Signed)
Pt is calling to request orders for cholesterol. He has 2 pills left and would like checked before he needs a refill

## 2018-01-31 ENCOUNTER — Other Ambulatory Visit: Payer: Medicare Other

## 2018-01-31 NOTE — Telephone Encounter (Signed)
I understand. Let's get the results and then go from there

## 2018-02-03 ENCOUNTER — Other Ambulatory Visit (INDEPENDENT_AMBULATORY_CARE_PROVIDER_SITE_OTHER): Payer: Medicare Other

## 2018-02-03 DIAGNOSIS — E785 Hyperlipidemia, unspecified: Secondary | ICD-10-CM

## 2018-02-03 LAB — LIPID PANEL
Cholesterol: 137 mg/dL (ref 0–200)
HDL: 53.1 mg/dL (ref >39.00)
LDL Cholesterol: 67 mg/dL (ref 0–99)
NonHDL: 83.42
TRIGLYCERIDES: 83 mg/dL (ref 0.0–149.0)
Total CHOL/HDL Ratio: 3
VLDL: 16.6 mg/dL (ref 0.0–40.0)

## 2018-05-31 ENCOUNTER — Encounter: Payer: Self-pay | Admitting: Family Medicine

## 2018-05-31 NOTE — Telephone Encounter (Signed)
Dr. Sarajane Jews please advise if pt needs repeat labs now or repeat at his next CPX?  thanks

## 2018-06-01 NOTE — Telephone Encounter (Signed)
No I think we should check it at his next well exam

## 2018-07-11 ENCOUNTER — Ambulatory Visit (INDEPENDENT_AMBULATORY_CARE_PROVIDER_SITE_OTHER): Payer: Medicare Other | Admitting: Family Medicine

## 2018-07-11 ENCOUNTER — Encounter: Payer: Self-pay | Admitting: Family Medicine

## 2018-07-11 VITALS — BP 132/70 | HR 56 | Temp 97.5°F | Wt 190.5 lb

## 2018-07-11 DIAGNOSIS — L409 Psoriasis, unspecified: Secondary | ICD-10-CM | POA: Diagnosis not present

## 2018-07-11 NOTE — Progress Notes (Signed)
   Subjective:    Patient ID: [name], male    DOB: 1946/03/24, 72 y.o.   MRN: [id number]  HPI Here for one year of intermittent itchy skin in the ear canals. He had been treated for an itchy scaly rash on the scalp several years ago. Today the ears actually feel better.    Review of Systems  Constitutional: Negative.   Respiratory: Negative.   Cardiovascular: Negative.   Skin: Positive for rash.       Objective:   Physical Exam  Constitutional: He appears well-developed and well-nourished.  HENT:  Right Ear: External ear normal.  Left Ear: External ear normal.  Nose: Nose normal.  Mouth/Throat: Oropharynx is clear and moist.  Eyes: Conjunctivae are normal.  Neck: No thyromegaly present.  Pulmonary/Chest: Effort normal and breath sounds normal.  Skin: No rash noted.          Assessment & Plan:  He likely has some psoriasis. Try Triamcinolone cream bid to the ear canals prn.  Alysia Penna, MD

## 2018-07-12 ENCOUNTER — Telehealth: Payer: Self-pay | Admitting: *Deleted

## 2018-07-12 MED ORDER — TRIAMCINOLONE 0.1 % CREAM:EUCERIN CREAM 1:1: 1.0000 "application " | TOPICAL_CREAM | Freq: Two times a day (BID) | CUTANEOUS | 1 refills | Status: DC

## 2018-07-12 NOTE — Telephone Encounter (Signed)
Copied from [place] [id number]. Topic: General - Other >> Jul 12, 2018  1:28 PM Keene Breath wrote: Reason for CRM: Patient called to request a script for the medication for his psoriasis which he said the doctor was going to send in on yesterday, after his appointment.  Patient check with the pharmacy and they did not have the medication.  Please advise.  CB# [phone]   Triamcinolone cream bid to the ear canals prn.  This rx has been sent to the pharmacy for the pt.

## 2018-07-13 ENCOUNTER — Other Ambulatory Visit: Payer: Self-pay

## 2018-07-13 MED ORDER — TRIAMCINOLONE 0.1 % CREAM:EUCERIN CREAM 1:1: TOPICAL_CREAM | CUTANEOUS | 1 refills | Status: DC

## 2018-07-13 MED ORDER — TRIAMCINOLONE 0.1 % CREAM:EUCERIN CREAM 1:1: 1.0000 "application " | TOPICAL_CREAM | Freq: Two times a day (BID) | CUTANEOUS | 1 refills | Status: DC

## 2018-10-21 ENCOUNTER — Other Ambulatory Visit: Payer: Self-pay | Admitting: Family Medicine

## 2019-01-19 ENCOUNTER — Other Ambulatory Visit: Payer: Self-pay | Admitting: Family Medicine

## 2019-04-07 ENCOUNTER — Telehealth: Payer: Self-pay | Admitting: Family Medicine

## 2019-04-07 MED ORDER — ATORVASTATIN CALCIUM 20 MG PO TABS: 20.0000 mg | ORAL_TABLET | Freq: Every day | ORAL | 3 refills | Status: DC

## 2019-04-07 NOTE — Telephone Encounter (Signed)
See request °

## 2019-04-07 NOTE — Telephone Encounter (Signed)
Medication: atorvastatin (LIPITOR) 20 MG tablet [[id number]]   Has the patient contacted their pharmacy? Yes  (Agent: If no, request that the patient contact the pharmacy for the refill.) (Agent: If yes, when and what did the pharmacy advise?)  Preferred Pharmacy (with phone number or street name): Patient states that he is out of town in [place]. Requesting the script to be sent to the pharmacy below. [facility] [place] [place] [phone]  Agent: Please be advised that RX refills may take up to 3 business days. We ask that you follow-up with your pharmacy.

## 2019-04-07 NOTE — Telephone Encounter (Signed)
Rx has been sent in. Patient is aware. 

## 2019-04-08 ENCOUNTER — Encounter: Payer: Self-pay | Admitting: Family Medicine

## 2019-04-10 ENCOUNTER — Other Ambulatory Visit: Payer: Self-pay

## 2019-04-10 MED ORDER — METOPROLOL SUCCINATE ER 50 MG PO TB24: ORAL_TABLET | ORAL | 0 refills | Status: DC

## 2019-04-19 ENCOUNTER — Other Ambulatory Visit: Payer: Self-pay | Admitting: Family Medicine

## 2019-05-05 ENCOUNTER — Encounter: Payer: Self-pay | Admitting: Family Medicine

## 2019-06-29 ENCOUNTER — Other Ambulatory Visit: Payer: Self-pay | Admitting: Family Medicine

## 2019-08-01 ENCOUNTER — Other Ambulatory Visit: Payer: Self-pay | Admitting: Family Medicine

## 2019-08-14 ENCOUNTER — Encounter: Payer: Self-pay | Admitting: Family Medicine

## 2019-08-15 NOTE — Telephone Encounter (Signed)
No more are needed

## 2019-08-27 ENCOUNTER — Other Ambulatory Visit: Payer: Self-pay | Admitting: Family Medicine

## 2019-09-01 ENCOUNTER — Encounter: Payer: Self-pay | Admitting: Family Medicine

## 2019-09-04 ENCOUNTER — Other Ambulatory Visit: Payer: Self-pay

## 2019-09-04 ENCOUNTER — Telehealth (INDEPENDENT_AMBULATORY_CARE_PROVIDER_SITE_OTHER): Payer: Medicare Other | Admitting: Family Medicine

## 2019-09-04 DIAGNOSIS — I10 Essential (primary) hypertension: Secondary | ICD-10-CM | POA: Diagnosis not present

## 2019-09-04 MED ORDER — METOPROLOL SUCCINATE ER 50 MG PO TB24: ORAL_TABLET | ORAL | 3 refills | Status: DC

## 2019-09-04 NOTE — Telephone Encounter (Signed)
He was seen today

## 2019-09-04 NOTE — Progress Notes (Signed)
Virtual Visit via Video Note  I connected with the patient on 09/04/19 at  1:30 PM EST by a video enabled telemedicine application and verified that I am speaking with the correct person using two identifiers.  Location patient: home Location provider:work or home office Persons participating in the virtual visit: patient, provider  I discussed the limitations of evaluation and management by telemedicine and the availability of in person appointments. The patient expressed understanding and agreed to proceed.   HPI: Here for medication refills. He feels great. His BP lately has been averaging 110-130 over 70-80.    ROS: See pertinent positives and negatives per HPI.  Past Medical History:  Diagnosis Date  . Arthritis    hands and spine   . Asthma    as a child   . Elevated PSA   . Erectile dysfunction   . History of basal cell carcinoma excision    x2  scalp and head  . History of epididymitis   . Male stress incontinence   . Pneumonia    hx of in 1968  . Prostate cancer ([place]) 05/18/14   Gleason 4+3=7  . Wears contact lenses   . White coat hypertension     Past Surgical History:  Procedure Laterality Date  . COLONOSCOPY  02-19-2004  . LYMPHADENECTOMY Bilateral 08/02/2014   Procedure: LYMPHADENECTOMY;  Surgeon: Raynelle Bring, MD;  Location: [facility];  Service: Urology;  Laterality: Bilateral;  . PROSTATE BIOPSY N/A 05/18/2014   Procedure: TRANSRECTAL ULTRASOUND OF THE PROSTATE/BIOPSY;  Surgeon: Ardis Hughs, MD;  [facility];  Service: Urology;  Laterality: N/A;  . ROBOT ASSISTED LAPAROSCOPIC RADICAL PROSTATECTOMY N/A 08/02/2014   Procedure: ROBOTIC ASSISTED LAPAROSCOPIC RADICAL PROSTATECTOMY LEVEL 2;  Surgeon: Raynelle Bring, MD;  Location: [facility];  Service: Urology;  Laterality: N/A;    Family History  Problem Relation Age of Onset  . Dementia Mother   . Stroke Father   . Cancer Sister        breast, twin sister  . Dementia Maternal Aunt    . Dementia Maternal Uncle      Current Outpatient Medications:  .  atorvastatin (LIPITOR) 20 MG tablet, Take 1 tablet (20 mg total) by mouth daily., Disp: 90 tablet, Rfl: 3 .  fexofenadine-pseudoephedrine (ALLEGRA-D 24) 180-240 MG 24 hr tablet, Take 1 tablet by mouth daily., Disp: , Rfl:  .  metoprolol succinate (TOPROL-XL) 50 MG 24 hr tablet, TAKE 1 TABLET BY MOUTH EVERY DAY WITH OR IMMEDIATELY FOLLOWING A MEAL, Disp: 90 tablet, Rfl: 3 .  Triamcinolone Acetonide (TRIAMCINOLONE 0.1 % CREAM : EUCERIN) CREA, One application BID topically to each ear canal, Disp: 1 each, Rfl: 1  EXAM:  VITALS per patient if applicable:  GENERAL: alert, oriented, appears well and in no acute distress  HEENT: atraumatic, conjunttiva clear, no obvious abnormalities on inspection of external nose and ears  NECK: normal movements of the head and neck  LUNGS: on inspection no signs of respiratory distress, breathing rate appears normal, no obvious gross SOB, gasping or wheezing  CV: no obvious cyanosis  MS: moves all visible extremities without noticeable abnormality  PSYCH/NEURO: pleasant and cooperative, no obvious depression or anxiety, speech and thought processing grossly intact  ASSESSMENT AND PLAN: HTN, stable. Metoprolol was refilled.  Alysia Penna, MD  Discussed the following assessment and plan:  No diagnosis found.     I discussed the assessment and treatment plan with the patient. The patient was provided an opportunity to ask  questions and all were answered. The patient agreed with the plan and demonstrated an understanding of the instructions.   The patient was advised to call back or seek an in-person evaluation if the symptoms worsen or if the condition fails to improve as anticipated.

## 2019-09-08 ENCOUNTER — Encounter: Payer: Self-pay | Admitting: Family Medicine

## 2019-09-08 DIAGNOSIS — C61 Malignant neoplasm of prostate: Secondary | ICD-10-CM

## 2019-09-08 DIAGNOSIS — R739 Hyperglycemia, unspecified: Secondary | ICD-10-CM

## 2019-09-08 DIAGNOSIS — E785 Hyperlipidemia, unspecified: Secondary | ICD-10-CM

## 2019-09-11 NOTE — Telephone Encounter (Signed)
No problems. He should get the vaccine

## 2019-09-22 ENCOUNTER — Other Ambulatory Visit: Payer: Self-pay

## 2019-09-22 ENCOUNTER — Other Ambulatory Visit (INDEPENDENT_AMBULATORY_CARE_PROVIDER_SITE_OTHER): Payer: Medicare Other

## 2019-09-22 DIAGNOSIS — C61 Malignant neoplasm of prostate: Secondary | ICD-10-CM

## 2019-09-22 DIAGNOSIS — E785 Hyperlipidemia, unspecified: Secondary | ICD-10-CM | POA: Diagnosis not present

## 2019-09-22 DIAGNOSIS — R739 Hyperglycemia, unspecified: Secondary | ICD-10-CM

## 2019-09-22 LAB — LIPID PANEL
Cholesterol: 140 mg/dL (ref 0–200)
HDL: 58.1 mg/dL (ref >39.00)
LDL Cholesterol: 64 mg/dL (ref 0–99)
NonHDL: 81.59
Total CHOL/HDL Ratio: 2
Triglycerides: 89 mg/dL (ref 0.0–149.0)
VLDL: 17.8 mg/dL (ref 0.0–40.0)

## 2019-09-22 LAB — BASIC METABOLIC PANEL
BUN: 20 mg/dL (ref 6–23)
CO2: 30 mEq/L (ref 19–32)
Calcium: 9.4 mg/dL (ref 8.4–10.5)
Chloride: 102 mEq/L (ref 96–112)
Creatinine, Ser: 0.96 mg/dL (ref 0.40–1.50)
GFR: 76.83 mL/min (ref >60.00)
Glucose, Bld: 92 mg/dL (ref 70–99)
Potassium: 4.7 mEq/L (ref 3.5–5.1)
Sodium: 138 mEq/L (ref 135–145)

## 2019-09-22 LAB — CBC WITH DIFFERENTIAL/PLATELET
Basophils Absolute: 0 10*3/uL (ref 0.0–0.1)
Basophils Relative: 0.3 % (ref 0.0–3.0)
Eosinophils Absolute: 0.3 10*3/uL (ref 0.0–0.7)
Eosinophils Relative: 4.9 % (ref 0.0–5.0)
HCT: 39.8 % (ref 39.0–52.0)
Hemoglobin: 13.8 g/dL (ref 13.0–17.0)
Lymphocytes Relative: 29.5 % (ref 12.0–46.0)
Lymphs Abs: 1.9 10*3/uL (ref 0.7–4.0)
MCHC: 34.6 g/dL (ref 30.0–36.0)
MCV: 93.3 fl (ref 78.0–100.0)
Monocytes Absolute: 0.4 10*3/uL (ref 0.1–1.0)
Monocytes Relative: 6.1 % (ref 3.0–12.0)
Neutro Abs: 3.8 10*3/uL (ref 1.4–7.7)
Neutrophils Relative %: 59.2 % (ref 43.0–77.0)
Platelets: 321 10*3/uL (ref 150.0–400.0)
RBC: 4.26 Mil/uL (ref 4.22–5.81)
RDW: 12.4 % (ref 11.5–15.5)
WBC: 6.5 10*3/uL (ref 4.0–10.5)

## 2019-09-22 LAB — HEMOGLOBIN A1C: Hgb A1c MFr Bld: 4.9 % (ref 4.6–6.5)

## 2019-09-22 LAB — TSH: TSH: 2.6 u[IU]/mL (ref 0.35–4.50)

## 2019-09-22 LAB — HEPATIC FUNCTION PANEL
ALT: 44 U/L (ref 0–53)
AST: 29 U/L (ref 0–37)
Albumin: 4.4 g/dL (ref 3.5–5.2)
Alkaline Phosphatase: 48 U/L (ref 39–117)
Bilirubin, Direct: 0.3 mg/dL (ref 0.0–0.3)
Total Bilirubin: 1.1 mg/dL (ref 0.2–1.2)
Total Protein: 6.7 g/dL (ref 6.0–8.3)

## 2019-09-22 LAB — PSA: PSA: 0 ng/mL — ABNORMAL LOW (ref 0.10–4.00)

## 2019-09-22 NOTE — Telephone Encounter (Signed)
Done

## 2019-09-25 ENCOUNTER — Ambulatory Visit: Payer: Medicare Other

## 2019-09-26 ENCOUNTER — Other Ambulatory Visit: Payer: Medicare Other

## 2019-09-30 ENCOUNTER — Ambulatory Visit: Payer: Medicare Other | Attending: Internal Medicine

## 2019-09-30 DIAGNOSIS — Z23 Encounter for immunization: Secondary | ICD-10-CM | POA: Insufficient documentation

## 2019-09-30 NOTE — Progress Notes (Signed)
   Covid-19 Vaccination Clinic  Name:  Lafayette Meech    MRN: [id number] DOB: July 21, 1946  09/30/2019  Mr. Toruno was observed post Covid-19 immunization for 15 minutes without incidence. He was provided with Vaccine Information Sheet and instruction to access the V-Safe system.   Mr. Borsellino was instructed to call 911 with any severe reactions post vaccine: Marland Kitchen Difficulty breathing  . Swelling of your face and throat  . A fast heartbeat  . A bad rash all over your body  . Dizziness and weakness    Immunizations Administered    Name Date Dose VIS Date Route   Pfizer COVID-19 Vaccine 09/30/2019 12:04 PM 0.3 mL 08/04/2019 Intramuscular   Manufacturer: [place]   Lot: [id number]   [employer]: [id number]

## 2019-10-01 ENCOUNTER — Ambulatory Visit: Payer: Medicare Other

## 2019-10-10 ENCOUNTER — Other Ambulatory Visit: Payer: Self-pay

## 2019-10-11 ENCOUNTER — Encounter: Payer: Self-pay | Admitting: Family Medicine

## 2019-10-11 ENCOUNTER — Ambulatory Visit (INDEPENDENT_AMBULATORY_CARE_PROVIDER_SITE_OTHER): Payer: Medicare Other | Admitting: Family Medicine

## 2019-10-11 VITALS — BP 122/68 | HR 72 | Temp 97.7°F | Wt 194.4 lb

## 2019-10-11 DIAGNOSIS — Z23 Encounter for immunization: Secondary | ICD-10-CM

## 2019-10-11 DIAGNOSIS — I10 Essential (primary) hypertension: Secondary | ICD-10-CM

## 2019-10-11 DIAGNOSIS — C61 Malignant neoplasm of prostate: Secondary | ICD-10-CM

## 2019-10-11 DIAGNOSIS — Z1211 Encounter for screening for malignant neoplasm of colon: Secondary | ICD-10-CM

## 2019-10-11 DIAGNOSIS — S61412A Laceration without foreign body of left hand, initial encounter: Secondary | ICD-10-CM

## 2019-10-11 DIAGNOSIS — E785 Hyperlipidemia, unspecified: Secondary | ICD-10-CM

## 2019-10-11 LAB — COLOGUARD

## 2019-10-11 NOTE — Progress Notes (Signed)
Subjective:    Patient ID: [name], male    DOB: [birth year], 74 y.o.   MRN: [id number]  HPI Here to follow up on issues. He feels great. He does mention a scrape to his left hand thjat occurred yesterday while he was working in his yard. He has applied Neosporin and a Bandaid. His BP is stable. He sees Urology yearly, and we recently checked labs where his PSA is 0.00. otherwise his labs were unremarkable.    Review of Systems  Constitutional: Negative.   HENT: Negative.   Eyes: Negative.   Respiratory: Negative.   Cardiovascular: Negative.   Gastrointestinal: Negative.   Genitourinary: Negative.   Musculoskeletal: Negative.   Skin: Positive for wound.  Neurological: Negative.   Psychiatric/Behavioral: Negative.        Objective:   Physical Exam Constitutional:      General: He is not in acute distress.    Appearance: He is well-developed. He is not diaphoretic.  HENT:     Head: Normocephalic and atraumatic.     Right Ear: External ear normal.     Left Ear: External ear normal.     Nose: Nose normal.     Mouth/Throat:     Pharynx: No oropharyngeal exudate.  Eyes:     General: No scleral icterus.       Right eye: No discharge.        Left eye: No discharge.     Conjunctiva/sclera: Conjunctivae normal.     Pupils: Pupils are equal, round, and reactive to light.  Neck:     Thyroid: No thyromegaly.     Vascular: No JVD.     Trachea: No tracheal deviation.  Cardiovascular:     Rate and Rhythm: Normal rate and regular rhythm.     Heart sounds: Normal heart sounds. No murmur. No friction rub. No gallop.   Pulmonary:     Effort: Pulmonary effort is normal. No respiratory distress.     Breath sounds: Normal breath sounds. No wheezing or rales.  Chest:     Chest wall: No tenderness.  Abdominal:     General: Bowel sounds are normal. There is no distension.     Palpations: Abdomen is soft. There is no mass.     Tenderness: There is no abdominal tenderness. There is  no guarding or rebound.  Genitourinary:    Penis: No tenderness.   Musculoskeletal:        General: No tenderness. Normal range of motion.     Cervical back: Neck supple.  Lymphadenopathy:     Cervical: No cervical adenopathy.  Skin:    General: Skin is warm and dry.     Coloration: Skin is not pale.     Findings: No erythema or rash.     Comments: The dorsal left hand has a small skin tear with the flap back in place   Neurological:     Mental Status: He is alert and oriented to person, place, and time.     Cranial Nerves: No cranial nerve deficit.     Motor: No abnormal muscle tone.     Coordination: Coordination normal.     Deep Tendon Reflexes: Reflexes are normal and symmetric. Reflexes normal.  Psychiatric:        Behavior: Behavior normal.        Thought Content: Thought content normal.        Judgment: Judgment normal.           Assessment & Plan:  His HTN is stable. His asthma is stable. His lipids are well controlled. He will follow up with Dr. Alinda Money at Urology. The skin laceration should heal nicely, but he is given a TD today.  Alysia Penna, MD

## 2019-10-24 ENCOUNTER — Ambulatory Visit: Payer: Medicare Other | Attending: Internal Medicine

## 2019-10-24 DIAGNOSIS — Z23 Encounter for immunization: Secondary | ICD-10-CM

## 2019-10-24 NOTE — Progress Notes (Signed)
   Covid-19 Vaccination Clinic  Name:  Andric Hotz    MRN: [id number] DOB: Jun 01, 1946  10/24/2019  Mr. Kump was observed post Covid-19 immunization for 15 minutes without incident. He was provided with Vaccine Information Sheet and instruction to access the V-Safe system.   Mr. Demus was instructed to call 911 with any severe reactions post vaccine: Marland Kitchen Difficulty breathing  . Swelling of face and throat  . A fast heartbeat  . A bad rash all over body  . Dizziness and weakness   Immunizations Administered    Name Date Dose VIS Date Route   Pfizer COVID-19 Vaccine 10/24/2019  9:58 AM 0.3 mL 08/04/2019 Intramuscular   Manufacturer: [place]   Lot: [id number]   [place]: [id number]

## 2019-10-25 ENCOUNTER — Encounter: Payer: Self-pay | Admitting: Family Medicine

## 2019-10-25 NOTE — Telephone Encounter (Signed)
That's fine. Please cancel the order

## 2020-05-22 ENCOUNTER — Other Ambulatory Visit: Payer: Self-pay | Admitting: Family Medicine

## 2020-05-26 ENCOUNTER — Other Ambulatory Visit: Payer: Self-pay | Admitting: Family Medicine

## 2020-05-29 ENCOUNTER — Encounter: Payer: Self-pay | Admitting: Family Medicine

## 2020-05-30 NOTE — Telephone Encounter (Signed)
We need more information. Please get this and incorporate into his chart

## 2020-08-19 ENCOUNTER — Encounter: Payer: Self-pay | Admitting: Family Medicine

## 2020-08-19 DIAGNOSIS — Z1211 Encounter for screening for malignant neoplasm of colon: Secondary | ICD-10-CM

## 2020-08-20 ENCOUNTER — Other Ambulatory Visit: Payer: Self-pay | Admitting: Family Medicine

## 2020-09-10 NOTE — Telephone Encounter (Signed)
Please order him a Cologuard test  

## 2020-09-12 ENCOUNTER — Other Ambulatory Visit: Payer: Self-pay

## 2020-09-12 ENCOUNTER — Ambulatory Visit (INDEPENDENT_AMBULATORY_CARE_PROVIDER_SITE_OTHER): Payer: Medicare Other

## 2020-09-12 VITALS — BP 118/64 | HR 58 | Temp 98.0°F | Ht 73.0 in | Wt 187.1 lb

## 2020-09-12 DIAGNOSIS — Z0000 Encounter for general adult medical examination without abnormal findings: Secondary | ICD-10-CM

## 2020-09-12 NOTE — Progress Notes (Signed)
Subjective:   [name] is a 75 y.o. male who presents for an Initial Medicare Annual Wellness Visit.  Review of Systems    N/A  Cardiac Risk Factors include: advanced age (>64men, >29 women);hypertension;male gender;dyslipidemia     Objective:    Today's Vitals   09/12/20 0949  BP: 118/64  Pulse: (!) 58  Temp: 98 F (36.7 C)  TempSrc: Oral  SpO2: 95%  Weight: 187 lb 2 oz (84.9 kg)  Height: 6\' 1"  (1.854 m)   Body mass index is 24.69 kg/m.  Advanced Directives 09/12/2020 09/28/2017 08/02/2014 08/02/2014 07/30/2014 05/18/2014  Does Patient Have a Medical Advance Directive? Yes No Yes - Yes Yes  Type of Paramedic of [place];Living will - [place];Living will [place];Living will [place];Living will [place]  Does patient want to make changes to medical advance directive? No - Patient declined - - - No - Patient declined No - Patient declined  Copy of [place] in Chart? No - copy requested - No - copy requested - No - copy requested Yes  Would patient like information on creating a medical advance directive? - No - Patient declined - - - -    Current Medications (verified) Outpatient Encounter Medications as of 09/12/2020  Medication Sig  . atorvastatin (LIPITOR) 20 MG tablet TAKE 1 TABLET BY MOUTH EVERY DAY  . fexofenadine-pseudoephedrine (ALLEGRA-D 24) 180-240 MG 24 hr tablet Take 1 tablet by mouth daily.  . metoprolol succinate (TOPROL-XL) 50 MG 24 hr tablet TAKE 1 TABLET BY MOUTH EVERY DAY WITH OR IMMEDIATELY FOLLOWING A MEAL  . Tadalafil 2.5 MG TABS   . Triamcinolone Acetonide (TRIAMCINOLONE 0.1 % CREAM : EUCERIN) CREA One application BID topically to each ear canal   No facility-administered encounter medications on file as of 09/12/2020.    Allergies (verified) Lisinopril, Nitrofurantoin, Penicillins, Doxycycline, and Levaquin [levofloxacin]    History: Past Medical History:  Diagnosis Date  . Arthritis    hands and spine   . Asthma    as a child   . Elevated PSA   . Erectile dysfunction   . History of basal cell carcinoma excision    x2  scalp and head  . History of epididymitis   . Male stress incontinence   . Pneumonia    hx of in 1968  . Prostate cancer ([place]) 05/18/14   Gleason 4+3=7  . Wears contact lenses   . White coat hypertension    Past Surgical History:  Procedure Laterality Date  . COLONOSCOPY  02-19-2004  . LYMPHADENECTOMY Bilateral 08/02/2014   Procedure: LYMPHADENECTOMY;  Surgeon: Raynelle Bring, MD;  Location: [facility];  Service: Urology;  Laterality: Bilateral;  . PROSTATE BIOPSY N/A 05/18/2014   Procedure: TRANSRECTAL ULTRASOUND OF THE PROSTATE/BIOPSY;  Surgeon: Ardis Hughs, MD;  Location: [facility];  Service: Urology;  Laterality: N/A;  . ROBOT ASSISTED LAPAROSCOPIC RADICAL PROSTATECTOMY N/A 08/02/2014   Procedure: ROBOTIC ASSISTED LAPAROSCOPIC RADICAL PROSTATECTOMY LEVEL 2;  Surgeon: Raynelle Bring, MD;  Location: [facility];  Service: Urology;  Laterality: N/A;   Family History  Problem Relation Age of Onset  . Dementia Mother   . Stroke Father   . Cancer Sister        breast, twin sister  . Dementia Maternal Aunt   . Dementia Maternal Uncle    Social History   Socioeconomic History  . Marital status: Married  Spouse name: Not on file  . Number of children: Not on file  . Years of education: Not on file  . Highest education level: Not on file  Occupational History  . Not on file  Tobacco Use  . Smoking status: Never Smoker  . Smokeless tobacco: Never Used  Substance and Sexual Activity  . Alcohol use: Yes    Alcohol/week: 14.0 standard drinks    Types: 14 Cans of beer per week    Comment: 2 beer per day  . Drug use: No  . Sexual activity: Not on file  Other Topics Concern  . Not on file  Social History Narrative  . Not on file   Social Determinants of  Health   Financial Resource Strain: Low Risk   . Difficulty of Paying Living Expenses: Not hard at all  Food Insecurity: No Food Insecurity  . Worried About Charity fundraiser in the Last Year: Never true  . Ran Out of Food in the Last Year: Never true  Transportation Needs: No Transportation Needs  . Lack of Transportation (Medical): No  . Lack of Transportation (Non-Medical): No  Physical Activity: Sufficiently Active  . Days of Exercise per Week: 5 days  . Minutes of Exercise per Session: 40 min  Stress: No Stress Concern Present  . Feeling of Stress : Not at all  Social Connections: Socially Isolated  . Frequency of Communication with Friends and Family: Twice a week  . Frequency of Social Gatherings with Friends and Family: Never  . Attends Religious Services: Never  . Active Member of Clubs or Organizations: No  . Attends Archivist Meetings: Never  . Marital Status: Married    Tobacco Counseling Counseling given: Not Answered   Clinical Intake:  Pre-visit preparation completed: Yes  Pain : No/denies pain     Nutritional Risks: None Diabetes: No  How often do you need to have someone help you when you read instructions, pamphlets, or other written materials from your doctor or pharmacy?: 1 - Never What is the last grade level you completed in school?: College  Diabetic?No   Interpreter Needed?: No  Information entered by :: [place] of Daily Living In your present state of health, do you have any difficulty performing the following activities: 09/12/2020  Hearing? Y  Comment has bilateral hearing aids  Vision? N  Difficulty concentrating or making decisions? Y  Walking or climbing stairs? N  Dressing or bathing? N  Doing errands, shopping? N  Preparing Food and eating ? N  Using the Toilet? N  In the past six months, have you accidently leaked urine? Y  Comment has urinary incontience  Do you have problems with loss of bowel  control? N  Managing your Medications? N  Managing your Finances? N  Housekeeping or managing your Housekeeping? N  Some recent data might be hidden    Patient Care Team: Laurey Morale, MD as PCP - General  Indicate any recent Medical Services you may have received from other than [facility] providers in the past year (date may be approximate).     Assessment:   This is a routine wellness examination for [place].  Hearing/Vision screen  Hearing Screening   125Hz  250Hz  500Hz  1000Hz  2000Hz  3000Hz  4000Hz  6000Hz  8000Hz   Right ear:           Left ear:           Vision Screening Comments: Patient states gets eyes checked once per year  Dietary issues and exercise activities discussed: Current Exercise Habits: Home exercise routine;Structured exercise class, Type of exercise: strength training/weights (bicycling), Time (Minutes): 45, Frequency (Times/Week): 5, Weekly Exercise (Minutes/Week): 225, Intensity: Moderate  Goals    . Weight (lb) < 177 lb (80.3 kg)      Depression Screen PHQ 2/9 Scores 09/12/2020 10/11/2019 10/13/2017 06/07/2014 02/27/2014  PHQ - 2 Score 0 0 1 0 0    Fall Risk Fall Risk  09/12/2020 10/11/2019 10/13/2017 04/21/2016 06/07/2014  Falls in the past year? 0 0 No No No  Comment - - - Emmi Telephone Survey: data to providers prior to load -  Number falls in past yr: 0 0 - - -  Injury with Fall? 0 0 - - -  Risk for fall due to : No Fall Risks - - - -  Follow up Falls evaluation completed;Falls prevention discussed Falls evaluation completed - - -    FALL RISK PREVENTION PERTAINING TO THE HOME:  Any stairs in or around the home? Yes  If so, are there any without handrails? No  Home free of loose throw rugs in walkways, pet beds, electrical cords, etc? Yes  Adequate lighting in your home to reduce risk of falls? Yes   ASSISTIVE DEVICES UTILIZED TO PREVENT FALLS:  Life alert? No  Use of a cane, walker or w/c? No  Grab bars in the bathroom? No  Shower chair or bench  in shower? No  Elevated toilet seat or a handicapped toilet? No   TIMED UP AND GO:  Was the test performed? Yes .  Length of time to ambulate 10 feet: 3 sec.   Gait steady and fast without use of assistive device  Cognitive Function:     6CIT Screen 09/12/2020  What Year? 0 points  What month? 0 points  What time? 0 points  Count back from 20 0 points  Months in reverse 0 points  Repeat phrase 2 points  Total Score 2    Immunizations Immunization History  Administered Date(s) Administered  . Fluad Quad(high Dose 65+) 04/24/2019  . Hepatitis A, Adult 01/08/2014, 07/26/2014  . Influenza, High Dose Seasonal PF 06/15/2017, 05/28/2020  . Influenza-Unspecified 06/16/2014, 05/06/2015, 05/02/2018, 04/25/2019  . PFIZER(Purple Top)SARS-COV-2 Vaccination 09/30/2019, 10/24/2019, 05/28/2020  . Pneumococcal Conjugate-13 07/09/2017  . Pneumococcal Polysaccharide-23 05/04/2019  . Td 10/11/2019    TDAP status: Up to date  Flu Vaccine status: Up to date  Pneumococcal vaccine status: Up to date  Covid-19 vaccine status: Completed vaccines  Qualifies for Shingles Vaccine? Yes   Zostavax completed No   Shingrix Completed?: No.    Education has been provided regarding the importance of this vaccine. Patient has been advised to call insurance company to determine out of pocket expense if they have not yet received this vaccine. Advised may also receive vaccine at local pharmacy or Health Dept. Verbalized acceptance and understanding.  Screening Tests Health Maintenance  Topic Date Due  . Hepatitis C Screening  Never done  . Fecal DNA (Cologuard)  11/09/2020  . COVID-19 Vaccine (4 - Booster for Pfizer series) 11/26/2020  . TETANUS/TDAP  10/10/2029  . INFLUENZA VACCINE  Completed  . PNA vac Low Risk Adult  Completed    Health Maintenance  Health Maintenance Due  Topic Date Due  . Hepatitis C Screening  Never done    Colorectal cancer screening: Type of screening: Cologuard.  Completed 11/09/2017. Repeat every 3 years  Lung Cancer Screening: (Low Dose CT Chest recommended if Age 56-80  years, 30 pack-year currently smoking OR have quit w/in 15years.) does not qualify.   Lung Cancer Screening Referral: N/A   Additional Screening:  Hepatitis C Screening: does qualify;  Vision Screening: Recommended annual ophthalmology exams for early detection of glaucoma and other disorders of the eye. Is the patient up to date with their annual eye exam?  Yes  Who is the provider or what is the name of the office in which the patient attends annual eye exams? Dr. Macarthur Critchley  If pt is not established with a provider, would they like to be referred to a provider to establish care? No .   Dental Screening: Recommended annual dental exams for proper oral hygiene  Community Resource Referral / Chronic Care Management: CRR required this visit?  No   CCM required this visit?  No      Plan:     I have personally reviewed and noted the following in the patient's chart:   . Medical and social history . Use of alcohol, tobacco or illicit drugs  . Current medications and supplements . Functional ability and status . Nutritional status . Physical activity . Advanced directives . List of other physicians . Hospitalizations, surgeries, and ER visits in previous 12 months . Vitals . Screenings to include cognitive, depression, and falls . Referrals and appointments  In addition, I have reviewed and discussed with patient certain preventive protocols, quality metrics, and best practice recommendations. A written personalized care plan for preventive services as well as general preventive health recommendations were provided to patient.     Ofilia Neas, LPN   [id number]   Nurse Notes: None

## 2020-09-12 NOTE — Patient Instructions (Signed)
[name] , Thank you for taking time to come for your Medicare Wellness Visit. I appreciate your ongoing commitment to your health goals. Please review the following plan we discussed and let me know if I can assist you in the future.   Screening recommendations/referrals: Colonoscopy: Up to date, next cologuard due 11/09/2020 Recommended yearly ophthalmology/optometry visit for glaucoma screening and checkup Recommended yearly dental visit for hygiene and checkup  Vaccinations: Influenza vaccine: Up to date, next due fall 2022  Pneumococcal vaccine: Completed series  Tdap vaccine: Up to date, next due 05/28/2030 Shingles vaccine: You may receive the shingrix if you wish, if you would like to receive we recommend that you do so at your local pharmacy     Advanced directives: Please bring copies of your advanced medical directives into our office so that we may scan them into your chart.   Conditions/risks identified: None   Next appointment: 09/13/2020 @ 10:30 am with Dr. Sarajane Jews   Preventive Care 65 Years and Older, Male Preventive care refers to lifestyle choices and visits with your health care provider that can promote health and wellness. What does preventive care include?  A yearly physical exam. This is also called an annual well check.  Dental exams once or twice a year.  Routine eye exams. Ask your health care provider how often you should have your eyes checked.  Personal lifestyle choices, including:  Daily care of your teeth and gums.  Regular physical activity.  Eating a healthy diet.  Avoiding tobacco and drug use.  Limiting alcohol use.  Practicing safe sex.  Taking low doses of aspirin every day.  Taking vitamin and mineral supplements as recommended by your health care provider. What happens during an annual well check? The services and screenings done by your health care provider during your annual well check will depend on your age, overall health,  lifestyle risk factors, and family history of disease. Counseling  Your health care provider may ask you questions about your:  Alcohol use.  Tobacco use.  Drug use.  Emotional well-being.  Home and relationship well-being.  Sexual activity.  Eating habits.  History of falls.  Memory and ability to understand (cognition).  Work and work Statistician. Screening  You may have the following tests or measurements:  Height, weight, and BMI.  Blood pressure.  Lipid and cholesterol levels. These may be checked every 5 years, or more frequently if you are over 6 years old.  Skin check.  Lung cancer screening. You may have this screening every year starting at age 32 if you have a 30-pack-year history of smoking and currently smoke or have quit within the past 15 years.  Fecal occult blood test (FOBT) of the stool. You may have this test every year starting at age 4.  Flexible sigmoidoscopy or colonoscopy. You may have a sigmoidoscopy every 5 years or a colonoscopy every 10 years starting at age 61.  Prostate cancer screening. Recommendations will vary depending on your family history and other risks.  Hepatitis C blood test.  Hepatitis B blood test.  Sexually transmitted disease (STD) testing.  Diabetes screening. This is done by checking your blood sugar (glucose) after you have not eaten for a while (fasting). You may have this done every 1-3 years.  Abdominal aortic aneurysm (AAA) screening. You may need this if you are a current or former smoker.  Osteoporosis. You may be screened starting at age 29 if you are at high risk. Talk with your health  care provider about your test results, treatment options, and if necessary, the need for more tests. Vaccines  Your health care provider may recommend certain vaccines, such as:  Influenza vaccine. This is recommended every year.  Tetanus, diphtheria, and acellular pertussis (Tdap, Td) vaccine. You may need a Td booster  every 10 years.  Zoster vaccine. You may need this after age 70.  Pneumococcal 13-valent conjugate (PCV13) vaccine. One dose is recommended after age 3.  Pneumococcal polysaccharide (PPSV23) vaccine. One dose is recommended after age 62. Talk to your health care provider about which screenings and vaccines you need and how often you need them. This information is not intended to replace advice given to you by your health care provider. Make sure you discuss any questions you have with your health care provider. Document Released: 09/06/2015 Document Revised: 04/29/2016 Document Reviewed: 06/11/2015 Elsevier Interactive Patient Education  2017 [place] Prevention in the Home Falls can cause injuries. They can happen to people of all ages. There are many things you can do to make your home safe and to help prevent falls. What can I do on the outside of my home?  Regularly fix the edges of walkways and driveways and fix any cracks.  Remove anything that might make you trip as you walk through a door, such as a raised step or threshold.  Trim any bushes or trees on the path to your home.  Use bright outdoor lighting.  Clear any walking paths of anything that might make someone trip, such as rocks or tools.  Regularly check to see if handrails are loose or broken. Make sure that both sides of any steps have handrails.  Any raised decks and porches should have guardrails on the edges.  Have any leaves, snow, or ice cleared regularly.  Use sand or salt on walking paths during winter.  Clean up any spills in your garage right away. This includes oil or grease spills. What can I do in the bathroom?  Use night lights.  Install grab bars by the toilet and in the tub and shower. Do not use towel bars as grab bars.  Use non-skid mats or decals in the tub or shower.  If you need to sit down in the shower, use a plastic, non-slip stool.  Keep the floor dry. Clean up any  water that spills on the floor as soon as it happens.  Remove soap buildup in the tub or shower regularly.  Attach bath mats securely with double-sided non-slip rug tape.  Do not have throw rugs and other things on the floor that can make you trip. What can I do in the bedroom?  Use night lights.  Make sure that you have a light by your bed that is easy to reach.  Do not use any sheets or blankets that are too big for your bed. They should not hang down onto the floor.  Have a firm chair that has side arms. You can use this for support while you get dressed.  Do not have throw rugs and other things on the floor that can make you trip. What can I do in the kitchen?  Clean up any spills right away.  Avoid walking on wet floors.  Keep items that you use a lot in easy-to-reach places.  If you need to reach something above you, use a strong step stool that has a grab bar.  Keep electrical cords out of the way.  Do not use floor  polish or wax that makes floors slippery. If you must use wax, use non-skid floor wax.  Do not have throw rugs and other things on the floor that can make you trip. What can I do with my stairs?  Do not leave any items on the stairs.  Make sure that there are handrails on both sides of the stairs and use them. Fix handrails that are broken or loose. Make sure that handrails are as long as the stairways.  Check any carpeting to make sure that it is firmly attached to the stairs. Fix any carpet that is loose or worn.  Avoid having throw rugs at the top or bottom of the stairs. If you do have throw rugs, attach them to the floor with carpet tape.  Make sure that you have a light switch at the top of the stairs and the bottom of the stairs. If you do not have them, ask someone to add them for you. What else can I do to help prevent falls?  Wear shoes that:  Do not have high heels.  Have rubber bottoms.  Are comfortable and fit you well.  Are closed  at the toe. Do not wear sandals.  If you use a stepladder:  Make sure that it is fully opened. Do not climb a closed stepladder.  Make sure that both sides of the stepladder are locked into place.  Ask someone to hold it for you, if possible.  Clearly mark and make sure that you can see:  Any grab bars or handrails.  First and last steps.  Where the edge of each step is.  Use tools that help you move around (mobility aids) if they are needed. These include:  Canes.  Walkers.  Scooters.  Crutches.  Turn on the lights when you go into a dark area. Replace any light bulbs as soon as they burn out.  Set up your furniture so you have a clear path. Avoid moving your furniture around.  If any of your floors are uneven, fix them.  If there are any pets around you, be aware of where they are.  Review your medicines with your doctor. Some medicines can make you feel dizzy. This can increase your chance of falling. Ask your doctor what other things that you can do to help prevent falls. This information is not intended to replace advice given to you by your health care provider. Make sure you discuss any questions you have with your health care provider. Document Released: 06/06/2009 Document Revised: 01/16/2016 Document Reviewed: 09/14/2014 Elsevier Interactive Patient Education  2017 [employer].

## 2020-09-13 ENCOUNTER — Ambulatory Visit (INDEPENDENT_AMBULATORY_CARE_PROVIDER_SITE_OTHER): Payer: Medicare Other | Admitting: Family Medicine

## 2020-09-13 ENCOUNTER — Encounter: Payer: Self-pay | Admitting: Family Medicine

## 2020-09-13 VITALS — BP 110/62 | HR 59 | Temp 97.7°F | Ht 73.0 in | Wt 186.6 lb

## 2020-09-13 DIAGNOSIS — B353 Tinea pedis: Secondary | ICD-10-CM

## 2020-09-13 DIAGNOSIS — B354 Tinea corporis: Secondary | ICD-10-CM | POA: Diagnosis not present

## 2020-09-13 DIAGNOSIS — I781 Nevus, non-neoplastic: Secondary | ICD-10-CM | POA: Diagnosis not present

## 2020-09-13 MED ORDER — KETOCONAZOLE 2 % EX CREA: 1.0000 "application " | TOPICAL_CREAM | Freq: Two times a day (BID) | CUTANEOUS | 2 refills | Status: DC | PRN

## 2020-09-13 NOTE — Progress Notes (Signed)
° °  Subjective:    Patient ID: [name], male    DOB: [birth year], 75 y.o.   MRN: [id number]  HPI Here for several issues. First he has multiple tiny blood vessels under the skin in both ankles and feet, and he is worried about these. They are not symptomatic. Also there is an itchy spot on the right foot. Also he has itchy red areas in both armpits.    Review of Systems  Constitutional: Negative.   Respiratory: Negative.   Cardiovascular: Negative.   Skin: Positive for rash.       Objective:   Physical Exam Constitutional:      Appearance: Normal appearance. He is well-developed.  Cardiovascular:     Rate and Rhythm: Normal rate and regular rhythm.     Pulses: Normal pulses.     Heart sounds: Normal heart sounds.  Pulmonary:     Effort: Pulmonary effort is normal.     Breath sounds: Normal breath sounds.  Skin:    Comments: There are areas of telangectasia on both feet and ankles. There is a round area of red scaly skin on the medial right foot. Both axillae have patches or red macular skin   Neurological:     Mental Status: He is alert.           Assessment & Plan:  He has benign telangectasias on the feet./ I reassured him this is a cosmetic issue and nothing to worry about. For the fungal infection on the foot and in the axillae, he will apply Ketoconazole cream BID.  Alysia Penna, MD

## 2020-09-23 NOTE — Addendum Note (Signed)
Addended by: Amado Coe on: 09/23/2020 11:49 AM   Modules accepted: Orders

## 2020-10-01 DIAGNOSIS — Z8546 Personal history of malignant neoplasm of prostate: Secondary | ICD-10-CM | POA: Diagnosis not present

## 2020-10-08 DIAGNOSIS — Z8546 Personal history of malignant neoplasm of prostate: Secondary | ICD-10-CM | POA: Diagnosis not present

## 2020-10-08 DIAGNOSIS — N5201 Erectile dysfunction due to arterial insufficiency: Secondary | ICD-10-CM | POA: Diagnosis not present

## 2020-10-24 DIAGNOSIS — L820 Inflamed seborrheic keratosis: Secondary | ICD-10-CM | POA: Diagnosis not present

## 2020-10-24 DIAGNOSIS — L308 Other specified dermatitis: Secondary | ICD-10-CM | POA: Diagnosis not present

## 2020-10-24 DIAGNOSIS — D1801 Hemangioma of skin and subcutaneous tissue: Secondary | ICD-10-CM | POA: Diagnosis not present

## 2020-10-24 DIAGNOSIS — Z85828 Personal history of other malignant neoplasm of skin: Secondary | ICD-10-CM | POA: Diagnosis not present

## 2020-10-24 DIAGNOSIS — L821 Other seborrheic keratosis: Secondary | ICD-10-CM | POA: Diagnosis not present

## 2020-10-24 DIAGNOSIS — L720 Epidermal cyst: Secondary | ICD-10-CM | POA: Diagnosis not present

## 2020-10-24 DIAGNOSIS — D485 Neoplasm of uncertain behavior of skin: Secondary | ICD-10-CM | POA: Diagnosis not present

## 2020-10-24 DIAGNOSIS — D225 Melanocytic nevi of trunk: Secondary | ICD-10-CM | POA: Diagnosis not present

## 2020-10-25 DIAGNOSIS — H2513 Age-related nuclear cataract, bilateral: Secondary | ICD-10-CM | POA: Diagnosis not present

## 2020-11-06 ENCOUNTER — Encounter: Payer: Self-pay | Admitting: Family Medicine

## 2020-11-08 ENCOUNTER — Telehealth: Payer: Self-pay

## 2020-11-08 NOTE — Telephone Encounter (Signed)
Pt order for Cologuard was faxed to [facility] , pt was notified on MyChart portal

## 2020-11-08 NOTE — Telephone Encounter (Signed)
Spoke with pt advised per Dr Sarajane Jews to stop the Metoprolol and to monitor his BP, advised to call the office with a report in a few weeks, pt verbalized understanding

## 2020-11-08 NOTE — Telephone Encounter (Signed)
Tell him to stop the Metoprolol and continue to watch the BP. Give [place] a report back in a few weeks

## 2020-11-20 ENCOUNTER — Encounter (HOSPITAL_BASED_OUTPATIENT_CLINIC_OR_DEPARTMENT_OTHER): Payer: Self-pay

## 2020-11-20 ENCOUNTER — Emergency Department (HOSPITAL_BASED_OUTPATIENT_CLINIC_OR_DEPARTMENT_OTHER)
Admission: EM | Admit: 2020-11-20 | Discharge: 2020-11-20 | Disposition: A | Discharge status: Home / Self Care | Payer: Medicare Other | Attending: Emergency Medicine | Admitting: Emergency Medicine

## 2020-11-20 ENCOUNTER — Other Ambulatory Visit: Payer: Self-pay

## 2020-11-20 DIAGNOSIS — Z79899 Other long term (current) drug therapy: Secondary | ICD-10-CM | POA: Insufficient documentation

## 2020-11-20 DIAGNOSIS — I10 Essential (primary) hypertension: Secondary | ICD-10-CM | POA: Insufficient documentation

## 2020-11-20 DIAGNOSIS — J45909 Unspecified asthma, uncomplicated: Secondary | ICD-10-CM | POA: Diagnosis not present

## 2020-11-20 DIAGNOSIS — R300 Dysuria: Secondary | ICD-10-CM | POA: Insufficient documentation

## 2020-11-20 DIAGNOSIS — Z8546 Personal history of malignant neoplasm of prostate: Secondary | ICD-10-CM | POA: Insufficient documentation

## 2020-11-20 DIAGNOSIS — R350 Frequency of micturition: Secondary | ICD-10-CM | POA: Diagnosis not present

## 2020-11-20 LAB — URINALYSIS, ROUTINE W REFLEX MICROSCOPIC
Bilirubin Urine: NEGATIVE
Glucose, UA: NEGATIVE mg/dL
Hgb urine dipstick: NEGATIVE
Ketones, ur: NEGATIVE mg/dL
Leukocytes,Ua: NEGATIVE
Nitrite: NEGATIVE
Protein, ur: NEGATIVE mg/dL
Specific Gravity, Urine: 1.006 (ref 1.005–1.030)
pH: 7 (ref 5.0–8.0)

## 2020-11-20 MED ORDER — CEPHALEXIN 500 MG PO CAPS: 500.0000 mg | ORAL_CAPSULE | Freq: Two times a day (BID) | ORAL | 0 refills | Status: AC

## 2020-11-20 NOTE — ED Provider Notes (Signed)
[facility] EMERGENCY DEPT Provider Note   CSN: [id number] Arrival date & time: 11/20/20  1220     History Chief Complaint  Patient presents with  . Urinary Frequency    [name] is a 75 y.o. male.  75 yo M with a chief complaint of dysuria.  Increased urinary frequency urinary urgency.  Going on since yesterday.  Feels similar to his prior UTIs.  Gets 1 about every year ever since he had a prostate surgery.  He denies fevers or flank pain.  Feels like he empties his bladder without issue.  The history is provided by the patient.  Urinary Frequency Pertinent negatives include no chest pain, no abdominal pain, no headaches and no shortness of breath.  Illness Severity:  Moderate Onset quality:  Gradual Duration:  1 day Timing:  Constant Progression:  Worsening Chronicity:  Recurrent Associated symptoms: no abdominal pain, no chest pain, no congestion, no diarrhea, no fever, no headaches, no myalgias, no rash, no shortness of breath and no vomiting        Past Medical History:  Diagnosis Date  . Arthritis    hands and spine   . Asthma    as a child   . Elevated PSA   . Erectile dysfunction   . History of basal cell carcinoma excision    x2  scalp and head  . History of epididymitis   . Male stress incontinence   . Pneumonia    hx of in 1968  . Prostate cancer ([place]) 05/18/14   Gleason 4+3=7  . Wears contact lenses   . White coat hypertension     Patient Active Problem List   Diagnosis Date Noted  . Dyslipidemia 10/28/2017  . Aortic dilatation ([place]) 10/28/2017  . Family hx of aortic aneurysm 10/13/2017  . Prostate cancer ([place]) 08/02/2014  . Malignant neoplasm of prostate ([place]) 06/07/2014  . HTN (hypertension) 05/04/2013  . MELANOMA, HX OF 06/03/2010  . ALLERGIC RHINITIS 05/24/2007  . ASTHMA 05/24/2007    Past Surgical History:  Procedure Laterality Date  . COLONOSCOPY  02-19-2004  . LYMPHADENECTOMY Bilateral 08/02/2014   Procedure:  LYMPHADENECTOMY;  Surgeon: Raynelle Bring, MD;  Location: [facility];  Service: Urology;  Laterality: Bilateral;  . PROSTATE BIOPSY N/A 05/18/2014   Procedure: TRANSRECTAL ULTRASOUND OF THE PROSTATE/BIOPSY;  Surgeon: Ardis Hughs, MD;  Location: [facility];  Service: Urology;  Laterality: N/A;  . ROBOT ASSISTED LAPAROSCOPIC RADICAL PROSTATECTOMY N/A 08/02/2014   Procedure: ROBOTIC ASSISTED LAPAROSCOPIC RADICAL PROSTATECTOMY LEVEL 2;  Surgeon: Raynelle Bring, MD;  Location: [facility];  Service: Urology;  Laterality: N/A;       Family History  Problem Relation Age of Onset  . Dementia Mother   . Stroke Father   . Cancer Sister        breast, twin sister  . Dementia Maternal Aunt   . Dementia Maternal Uncle     Social History   Tobacco Use  . Smoking status: Never Smoker  . Smokeless tobacco: Never Used  Substance Use Topics  . Alcohol use: Yes    Alcohol/week: 14.0 standard drinks    Types: 14 Cans of beer per week    Comment: 2 beer per day  . Drug use: No    Home Medications Prior to Admission medications   Medication Sig Start Date End Date Taking? Authorizing Provider  cephALEXin (KEFLEX) 500 MG capsule Take 1 capsule (500 mg total) by mouth 2 (two) times daily for 7 days. 11/20/20  11/27/20 Yes Deno Etienne, DO  atorvastatin (LIPITOR) 20 MG tablet TAKE 1 TABLET BY MOUTH EVERY DAY 05/27/20   Laurey Morale, MD  fexofenadine-pseudoephedrine (ALLEGRA-D 24) 180-240 MG 24 hr tablet Take 1 tablet by mouth daily.    [provider]  ketoconazole (NIZORAL) 2 % cream Apply 1 application topically 2 (two) times daily as needed for irritation. 09/13/20   Laurey Morale, MD  metoprolol succinate (TOPROL-XL) 50 MG 24 hr tablet TAKE 1 TABLET BY MOUTH EVERY DAY WITH OR IMMEDIATELY FOLLOWING A MEAL 08/20/20   Laurey Morale, MD  Tadalafil 2.5 MG TABS  10/04/19   [provider]  Triamcinolone Acetonide (TRIAMCINOLONE 0.1 % CREAM : EUCERIN) CREA One application BID  topically to each ear canal 07/13/18   Laurey Morale, MD    Allergies    Lisinopril, Nitrofurantoin, Penicillins, Doxycycline, and Levaquin [levofloxacin]  Review of Systems   Review of Systems  Constitutional: Negative for chills and fever.  HENT: Negative for congestion and facial swelling.   Eyes: Negative for discharge and visual disturbance.  Respiratory: Negative for shortness of breath.   Cardiovascular: Negative for chest pain and palpitations.  Gastrointestinal: Negative for abdominal pain, diarrhea and vomiting.  Genitourinary: Positive for dysuria and frequency.  Musculoskeletal: Negative for arthralgias and myalgias.  Skin: Negative for color change and rash.  Neurological: Negative for tremors, syncope and headaches.  Psychiatric/Behavioral: Negative for confusion and dysphoric mood.    Physical Exam Updated Vital Signs BP (!) 147/78 (BP Location: Right Arm)   Pulse 65   Temp (!) 97.5 F (36.4 C) (Oral)   Resp 16   SpO2 100%   Physical Exam Vitals and nursing note reviewed.  Constitutional:      Appearance: He is well-developed.  HENT:     Head: Normocephalic and atraumatic.  Eyes:     Pupils: Pupils are equal, round, and reactive to light.  Neck:     Vascular: No JVD.  Cardiovascular:     Rate and Rhythm: Normal rate and regular rhythm.     Heart sounds: No murmur heard. No friction rub. No gallop.   Pulmonary:     Effort: No respiratory distress.     Breath sounds: No wheezing.  Abdominal:     General: There is no distension.     Tenderness: There is no abdominal tenderness. There is no right CVA tenderness, left CVA tenderness, guarding or rebound.  Musculoskeletal:        General: Normal range of motion.     Cervical back: Normal range of motion and neck supple.  Skin:    Coloration: Skin is not pale.     Findings: No rash.  Neurological:     Mental Status: He is alert and oriented to person, place, and time.  Psychiatric:        Behavior:  Behavior normal.     ED Results / Procedures / Treatments   Labs (all labs ordered are listed, but only abnormal results are displayed) Labs Reviewed  URINALYSIS, ROUTINE W REFLEX MICROSCOPIC - Abnormal; Notable for the following components:      Result Value   Color, Urine COLORLESS (*)    All other components within normal limits  URINE CULTURE    EKG None  Radiology No results found.  Procedures Procedures   Medications Ordered in ED Medications - No data to display  ED Course  I have reviewed the triage vital signs and the nursing notes.  Pertinent labs &  imaging results that were available during my care of the patient were reviewed by me and considered in my medical decision making (see chart for details).    MDM Rules/Calculators/A&P                          75 yo M with urinary symptoms that are similar to when he had a UTI in the past.  Denies fevers or flank pain.  Well-appearing nontoxic.  Will obtain a UA.  UA is completely clean.  With the patient having multiple similar presentations felt to be urinary tract infection in the past I will start on antibiotics.  We will send off for culture.  Recommended PCP and urology follow-up.  1:13 PM:  I have discussed the diagnosis/risks/treatment options with the patient and believe the pt to be eligible for discharge home to follow-up with PCP, Urology. We also discussed returning to the ED immediately if new or worsening sx occur. We discussed the sx which are most concerning (e.g., sudden worsening pain, fever, inability to tolerate by mouth, flank pain) that necessitate immediate return. Medications administered to the patient during their visit and any new prescriptions provided to the patient are listed below.  Medications given during this visit Medications - No data to display   The patient appears reasonably screen and/or stabilized for discharge and I doubt any other medical condition or other [facility] requiring  further screening, evaluation, or treatment in the ED at this time prior to discharge.   Final Clinical Impression(s) / ED Diagnoses Final diagnoses:  Urinary frequency    Rx / DC Orders ED Discharge Orders         Ordered    cephALEXin (KEFLEX) 500 MG capsule  2 times daily        11/20/20 [place], [place], DO 11/20/20 1313

## 2020-11-20 NOTE — Discharge Instructions (Addendum)
Follow up with your PCP or urologist.  Return for worsening pain, fever, difficulty urinating.

## 2020-11-20 NOTE — ED Triage Notes (Signed)
He tells me that ever since undergoing prostatectomy in 2015 "I get one u.t.i. a year". He is here today with c/o urinary urgency and mild frequency. He denies fever/cough, nor any other  Sign of current illness.

## 2020-11-21 DIAGNOSIS — Z1212 Encounter for screening for malignant neoplasm of rectum: Secondary | ICD-10-CM | POA: Diagnosis not present

## 2020-11-21 DIAGNOSIS — Z1211 Encounter for screening for malignant neoplasm of colon: Secondary | ICD-10-CM | POA: Diagnosis not present

## 2020-11-21 LAB — URINE CULTURE: Culture: NO GROWTH

## 2020-11-25 NOTE — Telephone Encounter (Signed)
Excellent. Just tell him to keep doing what he is doing

## 2020-11-28 HISTORY — PX: COLONOSCOPY: SHX174

## 2020-11-28 LAB — COLOGUARD
COLOGUARD: NEGATIVE
Cologuard: NEGATIVE

## 2020-12-02 ENCOUNTER — Encounter: Payer: Self-pay | Admitting: Family Medicine

## 2020-12-04 ENCOUNTER — Encounter: Payer: Self-pay | Admitting: Family Medicine

## 2020-12-07 ENCOUNTER — Emergency Department (HOSPITAL_BASED_OUTPATIENT_CLINIC_OR_DEPARTMENT_OTHER)
Admission: EM | Admit: 2020-12-07 | Discharge: 2020-12-07 | Disposition: A | Discharge status: Home / Self Care | Payer: Medicare Other | Attending: Emergency Medicine | Admitting: Emergency Medicine

## 2020-12-07 ENCOUNTER — Encounter (HOSPITAL_BASED_OUTPATIENT_CLINIC_OR_DEPARTMENT_OTHER): Payer: Self-pay

## 2020-12-07 ENCOUNTER — Other Ambulatory Visit: Payer: Self-pay

## 2020-12-07 DIAGNOSIS — Z8546 Personal history of malignant neoplasm of prostate: Secondary | ICD-10-CM | POA: Insufficient documentation

## 2020-12-07 DIAGNOSIS — Z85828 Personal history of other malignant neoplasm of skin: Secondary | ICD-10-CM | POA: Diagnosis not present

## 2020-12-07 DIAGNOSIS — Z79899 Other long term (current) drug therapy: Secondary | ICD-10-CM | POA: Insufficient documentation

## 2020-12-07 DIAGNOSIS — J45909 Unspecified asthma, uncomplicated: Secondary | ICD-10-CM | POA: Insufficient documentation

## 2020-12-07 DIAGNOSIS — N3000 Acute cystitis without hematuria: Secondary | ICD-10-CM

## 2020-12-07 DIAGNOSIS — I10 Essential (primary) hypertension: Secondary | ICD-10-CM | POA: Insufficient documentation

## 2020-12-07 DIAGNOSIS — R350 Frequency of micturition: Secondary | ICD-10-CM | POA: Diagnosis present

## 2020-12-07 LAB — URINALYSIS, ROUTINE W REFLEX MICROSCOPIC
Bilirubin Urine: NEGATIVE
Glucose, UA: NEGATIVE mg/dL
Ketones, ur: NEGATIVE mg/dL
Nitrite: NEGATIVE
Specific Gravity, Urine: 1.006 (ref 1.005–1.030)
WBC, UA: >50 WBC/hpf — ABNORMAL HIGH (ref 0–5)
pH: 7 (ref 5.0–8.0)

## 2020-12-07 MED ORDER — CEPHALEXIN 500 MG PO CAPS: 500.0000 mg | ORAL_CAPSULE | Freq: Four times a day (QID) | ORAL | 0 refills | Status: DC

## 2020-12-07 NOTE — Discharge Instructions (Signed)
Take the antibiotic Keflex as directed for the next 7 days.  Make an appointment to follow-up with your primary care doctor this upcoming week to have the urine rechecked.  Urine has been sent for culture.  Return for any new or worse symptoms.

## 2020-12-07 NOTE — ED Notes (Signed)
Called lab to add urine culture test

## 2020-12-07 NOTE — ED Provider Notes (Signed)
[facility] EMERGENCY DEPT Provider Note   CSN: [id number] Arrival date & time: 12/07/20  2031     History Chief Complaint  Patient presents with  . Hematuria  . Urinary Frequency    [name] is a 75 y.o. male.  Patient has had difficulties with frequent urinary tract infections following the removal of his prostate.  That occurred in 2015.  He says usually gets urinary tract infection at least once a year.  Patient was seen March 30 with an equivocal urinalysis culture went on top to grow anything.  The patient took less than 1 week course of Keflex.  But when he was told that the culture was negative he stopped taking it.  Patient still has 3 days shy of a 7-day course left over.  Patient feels that he has a bladder infection starting this morning began to have minimal amount of urine had a urgency or frequency feeling.  No fevers no nausea vomiting or diarrhea.  No hematuria.        Past Medical History:  Diagnosis Date  . Arthritis    hands and spine   . Asthma    as a child   . Elevated PSA   . Erectile dysfunction   . History of basal cell carcinoma excision    x2  scalp and head  . History of epididymitis   . Male stress incontinence   . Pneumonia    hx of in 1968  . Prostate cancer ([place]) 05/18/14   Gleason 4+3=7  . Wears contact lenses   . White coat hypertension     Patient Active Problem List   Diagnosis Date Noted  . Dyslipidemia 10/28/2017  . Aortic dilatation ([place]) 10/28/2017  . Family hx of aortic aneurysm 10/13/2017  . Prostate cancer ([place]) 08/02/2014  . Malignant neoplasm of prostate ([place]) 06/07/2014  . HTN (hypertension) 05/04/2013  . MELANOMA, HX OF 06/03/2010  . ALLERGIC RHINITIS 05/24/2007  . ASTHMA 05/24/2007    Past Surgical History:  Procedure Laterality Date  . COLONOSCOPY  02-19-2004  . LYMPHADENECTOMY Bilateral 08/02/2014   Procedure: LYMPHADENECTOMY;  Surgeon: Raynelle Bring, MD;  Location: [facility];  Service:  Urology;  Laterality: Bilateral;  . PROSTATE BIOPSY N/A 05/18/2014   Procedure: TRANSRECTAL ULTRASOUND OF THE PROSTATE/BIOPSY;  Surgeon: Ardis Hughs, MD;  Location: [facility];  Service: Urology;  Laterality: N/A;  . ROBOT ASSISTED LAPAROSCOPIC RADICAL PROSTATECTOMY N/A 08/02/2014   Procedure: ROBOTIC ASSISTED LAPAROSCOPIC RADICAL PROSTATECTOMY LEVEL 2;  Surgeon: Raynelle Bring, MD;  Location: [facility];  Service: Urology;  Laterality: N/A;       Family History  Problem Relation Age of Onset  . Dementia Mother   . Stroke Father   . Cancer Sister        breast, twin sister  . Dementia Maternal Aunt   . Dementia Maternal Uncle     Social History   Tobacco Use  . Smoking status: Never Smoker  . Smokeless tobacco: Never Used  Substance Use Topics  . Alcohol use: Not Currently    Alcohol/week: 14.0 standard drinks    Types: 14 Cans of beer per week    Comment: 2 beer per day  . Drug use: No    Home Medications Prior to Admission medications   Medication Sig Start Date End Date Taking? Authorizing Provider  cephALEXin (KEFLEX) 500 MG capsule Take 1 capsule (500 mg total) by mouth 4 (four) times daily. 12/07/20  Yes Fredia Sorrow, MD  atorvastatin (LIPITOR) 20 MG tablet TAKE 1 TABLET BY MOUTH EVERY DAY 05/27/20   Laurey Morale, MD  fexofenadine-pseudoephedrine (ALLEGRA-D 24) 180-240 MG 24 hr tablet Take 1 tablet by mouth daily.    [provider]  ketoconazole (NIZORAL) 2 % cream Apply 1 application topically 2 (two) times daily as needed for irritation. 09/13/20   Laurey Morale, MD  metoprolol succinate (TOPROL-XL) 50 MG 24 hr tablet TAKE 1 TABLET BY MOUTH EVERY DAY WITH OR IMMEDIATELY FOLLOWING A MEAL 08/20/20   Laurey Morale, MD  Tadalafil 2.5 MG TABS  10/04/19   [provider]  Triamcinolone Acetonide (TRIAMCINOLONE 0.1 % CREAM : EUCERIN) CREA One application BID topically to each ear canal 07/13/18   Laurey Morale, MD    Allergies     Lisinopril, Nitrofurantoin, Penicillins, Doxycycline, and Levaquin [levofloxacin]  Review of Systems   Review of Systems  Constitutional: Negative for chills and fever.  HENT: Negative for rhinorrhea and sore throat.   Eyes: Negative for visual disturbance.  Respiratory: Negative for cough and shortness of breath.   Cardiovascular: Negative for chest pain and leg swelling.  Gastrointestinal: Negative for abdominal pain, diarrhea, nausea and vomiting.  Genitourinary: Positive for dysuria, frequency and urgency. Negative for hematuria.  Musculoskeletal: Negative for back pain and neck pain.  Skin: Negative for rash.  Neurological: Negative for dizziness, light-headedness and headaches.  Hematological: Does not bruise/bleed easily.  Psychiatric/Behavioral: Negative for confusion.    Physical Exam Updated Vital Signs BP (!) 152/96 (BP Location: Right Arm)   Pulse 64   Temp 98.3 F (36.8 C) (Oral)   Resp 16   Ht 1.854 m (6\' 1" )   Wt 74.5 kg   SpO2 100%   BMI 21.67 kg/m   Physical Exam Vitals and nursing note reviewed.  Constitutional:      Appearance: Normal appearance. He is well-developed. He is not toxic-appearing.  HENT:     Head: Normocephalic and atraumatic.  Eyes:     Extraocular Movements: Extraocular movements intact.     Conjunctiva/sclera: Conjunctivae normal.     Pupils: Pupils are equal, round, and reactive to light.  Cardiovascular:     Rate and Rhythm: Normal rate and regular rhythm.     Heart sounds: No murmur heard.   Pulmonary:     Effort: Pulmonary effort is normal. No respiratory distress.     Breath sounds: Normal breath sounds.  Abdominal:     General: There is no distension.     Palpations: Abdomen is soft.     Tenderness: There is no abdominal tenderness. There is no guarding.  Musculoskeletal:        General: Normal range of motion.     Cervical back: Normal range of motion and neck supple.  Skin:    General: Skin is warm and dry.      Capillary Refill: Capillary refill takes less than 2 seconds.  Neurological:     General: No focal deficit present.     Mental Status: He is alert and oriented to person, place, and time.     Cranial Nerves: No cranial nerve deficit.     Sensory: No sensory deficit.     Motor: No weakness.     ED Results / Procedures / Treatments   Labs (all labs ordered are listed, but only abnormal results are displayed) Labs Reviewed  URINALYSIS, ROUTINE W REFLEX MICROSCOPIC - Abnormal; Notable for the following components:      Result Value  Color, Urine COLORLESS (*)    Hgb urine dipstick LARGE (*)    Protein, ur TRACE (*)    Leukocytes,Ua LARGE (*)    WBC, UA >50 (*)    All other components within normal limits  URINE CULTURE    EKG None  Radiology No results found.  Procedures Procedures   Medications Ordered in ED Medications - No data to display  ED Course  I have reviewed the triage vital signs and the nursing notes.  Pertinent labs & imaging results that were available during my care of the patient were reviewed by me and considered in my medical decision making (see chart for details).    MDM Rules/Calculators/A&P                            Patient no acute distress.  Abdomen soft and nontender.  Urinalysis seems to be consistent with the urinary tract infection not hematuria.  Greater than 50 white blood cells.  Urine sent for culture.  And we will go ahead and and start him back on Keflex.  Prefer not to use Cipro.  He will make an appointment follow-up with his primary care doctor to make sure that the urine clears.  And they also can follow-up on the culture to make sure Keflex is is appropriate antibiotic.  Patient will return for any new or worse symptoms. Final Clinical Impression(s) / ED Diagnoses Final diagnoses:  Acute cystitis without hematuria    Rx / DC Orders ED Discharge Orders         Ordered    cephALEXin (KEFLEX) 500 MG capsule  4 times daily         12/07/20 2259           Fredia Sorrow, MD 12/08/20 0109

## 2020-12-07 NOTE — ED Triage Notes (Addendum)
Pt is present to the ED for a "possible bladder infection" and starting this morning patient began to have minimal amount of blood in his urine. Pt c/o urinary frequency. Denies any pain or N/V/D.   Hx of prostate cancer.

## 2020-12-09 LAB — URINE CULTURE: Culture: <10000 — AB

## 2020-12-12 DIAGNOSIS — Z23 Encounter for immunization: Secondary | ICD-10-CM | POA: Diagnosis not present

## 2020-12-16 ENCOUNTER — Other Ambulatory Visit: Payer: Self-pay

## 2020-12-16 ENCOUNTER — Encounter: Payer: Self-pay | Admitting: Family Medicine

## 2020-12-16 ENCOUNTER — Ambulatory Visit (INDEPENDENT_AMBULATORY_CARE_PROVIDER_SITE_OTHER): Payer: Medicare Other | Admitting: Family Medicine

## 2020-12-16 VITALS — BP 118/58 | HR 65 | Temp 98.0°F | Wt 170.0 lb

## 2020-12-16 DIAGNOSIS — N390 Urinary tract infection, site not specified: Secondary | ICD-10-CM

## 2020-12-16 DIAGNOSIS — N3090 Cystitis, unspecified without hematuria: Secondary | ICD-10-CM

## 2020-12-16 DIAGNOSIS — Z87440 Personal history of urinary (tract) infections: Secondary | ICD-10-CM

## 2020-12-16 DIAGNOSIS — R319 Hematuria, unspecified: Secondary | ICD-10-CM

## 2020-12-16 LAB — POC URINALSYSI DIPSTICK (AUTOMATED)
Bilirubin, UA: NEGATIVE
Blood, UA: NEGATIVE
Glucose, UA: NEGATIVE
Ketones, UA: NEGATIVE
Leukocytes, UA: NEGATIVE
Nitrite, UA: NEGATIVE
Protein, UA: NEGATIVE
Spec Grav, UA: 1.01 (ref 1.010–1.025)
Urobilinogen, UA: 1 E.U./dL
pH, UA: 7 (ref 5.0–8.0)

## 2020-12-16 NOTE — Progress Notes (Signed)
   Subjective:    Patient ID: [name], male    DOB: August 14, 1946, 75 y.o.   MRN: [id number]  HPI Here to follow up on 2 ED visits, one on 11-20-20 and the other on 12-07-20. Each time he had typical UTI symptoms of urinary burning and urgency. No fever or back pain or nausea. He drinks plenty of water every day. The first time he took Keflex 500 mg TID, but he stopped taking it after 3 days because he saw the urine culture in My Chart was not able to isolate a bacteria, so he thought is was negative. The second time he was put on Keflex QID, and he took this for 7 days. Now he feels fine and has no symptoms at all. His UA here today is clear.    Review of Systems  Constitutional: Negative.   Respiratory: Negative.   Cardiovascular: Negative.   Gastrointestinal: Negative.   Genitourinary: Negative.        Objective:   Physical Exam Constitutional:      Appearance: Normal appearance.  Cardiovascular:     Rate and Rhythm: Normal rate and regular rhythm.     Pulses: Normal pulses.     Heart sounds: Normal heart sounds.  Pulmonary:     Effort: Pulmonary effort is normal.     Breath sounds: Normal breath sounds.  Neurological:     Mental Status: He is alert.           Assessment & Plan:  UTI, now resolved. He will follow up as needed.  Alysia Penna, MD

## 2021-03-05 ENCOUNTER — Encounter: Payer: Self-pay | Admitting: Family Medicine

## 2021-03-05 NOTE — Telephone Encounter (Signed)
Cholesterol medications can cause muscle aches, but not usually cramps like he has. These are common but not related. I suggest he take magnesium 400 mg (otc) 2-3 tablets every night before bed

## 2021-03-06 DIAGNOSIS — U071 COVID-19: Secondary | ICD-10-CM | POA: Diagnosis not present

## 2021-05-31 ENCOUNTER — Other Ambulatory Visit: Payer: Self-pay | Admitting: Family Medicine

## 2021-06-06 DIAGNOSIS — Z23 Encounter for immunization: Secondary | ICD-10-CM | POA: Diagnosis not present

## 2021-06-16 ENCOUNTER — Other Ambulatory Visit: Payer: Self-pay | Admitting: Family Medicine

## 2021-09-03 DIAGNOSIS — M19071 Primary osteoarthritis, right ankle and foot: Secondary | ICD-10-CM | POA: Diagnosis not present

## 2021-09-03 DIAGNOSIS — M79671 Pain in right foot: Secondary | ICD-10-CM | POA: Diagnosis not present

## 2021-09-08 DIAGNOSIS — Z77098 Contact with and (suspected) exposure to other hazardous, chiefly nonmedicinal, chemicals: Secondary | ICD-10-CM | POA: Diagnosis not present

## 2021-09-11 ENCOUNTER — Telehealth: Payer: Self-pay | Admitting: Family Medicine

## 2021-09-11 NOTE — Telephone Encounter (Signed)
Patient wanted to let Dr.Fry know that he has tested positive for covid today. Patient declined appointment, stated that he was okay right now.      FYI

## 2021-09-11 NOTE — Telephone Encounter (Signed)
Spoke with patient to schedule awv  He stated he tested positive this morning for covid.   Right now he feels like he has the flu.  Patient  wanted to let you know that he is in an alzheimer study @  [facility].  He met with Neurology this morning.  He has elevated amaloid, some changes in memory His short term memory is worse, mild cognitive impairment, symptoms are mild.  They will test him again in a year

## 2021-09-11 NOTE — Telephone Encounter (Signed)
Left message for patient to call back and schedule Medicare Annual Wellness Visit (AWV) either virtually or in office. Left  my Herbie Drape number [phone]   Last AWV 09/12/20 ; please schedule at anytime with [facility] Nurse Health Advisor 1 or 2   This should be a 45 minute visit.

## 2021-09-11 NOTE — Telephone Encounter (Signed)
FYI

## 2021-09-11 NOTE — Telephone Encounter (Signed)
Noted  

## 2021-09-15 ENCOUNTER — Encounter: Payer: Self-pay | Admitting: Family Medicine

## 2021-09-15 ENCOUNTER — Ambulatory Visit (INDEPENDENT_AMBULATORY_CARE_PROVIDER_SITE_OTHER): Payer: Medicare Other

## 2021-09-15 VITALS — Ht 73.0 in | Wt 162.0 lb

## 2021-09-15 DIAGNOSIS — Z0000 Encounter for general adult medical examination without abnormal findings: Secondary | ICD-10-CM

## 2021-09-15 NOTE — Patient Instructions (Signed)
[name] , Thank you for taking time to come for your Medicare Wellness Visit. I appreciate your ongoing commitment to your health goals. Please review the following plan we discussed and let me know if I can assist you in the future.   Screening recommendations/referrals: Colonoscopy: cologuard 11/28/2020 Recommended yearly ophthalmology/optometry visit for glaucoma screening and checkup Recommended yearly dental visit for hygiene and checkup  Vaccinations: Influenza vaccine: completed 06/06/2021 Pneumococcal vaccine: completed 05/04/2019 Tdap vaccine: completed 10/11/2019, due 10/10/2029 Shingles vaccine: discussed   Covid-19:  12/12/2020, 05/28/2020, 10/24/2019, 09/30/2019  Advanced directives: Please bring a copy of your POA (Power of Attorney) and/or Living Will to your next appointment.   Conditions/risks identified: none  Next appointment: Follow up in one year for your annual wellness visit.   Preventive Care 64 Years and Older, Male Preventive care refers to lifestyle choices and visits with your health care provider that can promote health and wellness. What does preventive care include? A yearly physical exam. This is also called an annual well check. Dental exams once or twice a year. Routine eye exams. Ask your health care provider how often you should have your eyes checked. Personal lifestyle choices, including: Daily care of your teeth and gums. Regular physical activity. Eating a healthy diet. Avoiding tobacco and drug use. Limiting alcohol use. Practicing safe sex. Taking low doses of aspirin every day. Taking vitamin and mineral supplements as recommended by your health care provider. What happens during an annual well check? The services and screenings done by your health care provider during your annual well check will depend on your age, overall health, lifestyle risk factors, and family history of disease. Counseling  Your health care provider may ask you questions  about your: Alcohol use. Tobacco use. Drug use. Emotional well-being. Home and relationship well-being. Sexual activity. Eating habits. History of falls. Memory and ability to understand (cognition). Work and work Statistician. Screening  You may have the following tests or measurements: Height, weight, and BMI. Blood pressure. Lipid and cholesterol levels. These may be checked every 5 years, or more frequently if you are over 33 years old. Skin check. Lung cancer screening. You may have this screening every year starting at age 73 if you have a 30-pack-year history of smoking and currently smoke or have quit within the past 15 years. Fecal occult blood test (FOBT) of the stool. You may have this test every year starting at age 42. Flexible sigmoidoscopy or colonoscopy. You may have a sigmoidoscopy every 5 years or a colonoscopy every 10 years starting at age 82. Prostate cancer screening. Recommendations will vary depending on your family history and other risks. Hepatitis C blood test. Hepatitis B blood test. Sexually transmitted disease (STD) testing. Diabetes screening. This is done by checking your blood sugar (glucose) after you have not eaten for a while (fasting). You may have this done every 1-3 years. Abdominal aortic aneurysm (AAA) screening. You may need this if you are a current or former smoker. Osteoporosis. You may be screened starting at age 67 if you are at high risk. Talk with your health care provider about your test results, treatment options, and if necessary, the need for more tests. Vaccines  Your health care provider may recommend certain vaccines, such as: Influenza vaccine. This is recommended every year. Tetanus, diphtheria, and acellular pertussis (Tdap, Td) vaccine. You may need a Td booster every 10 years. Zoster vaccine. You may need this after age 37. Pneumococcal 13-valent conjugate (PCV13) vaccine. One dose is  recommended after age 8. Pneumococcal  polysaccharide (PPSV23) vaccine. One dose is recommended after age 85. Talk to your health care provider about which screenings and vaccines you need and how often you need them. This information is not intended to replace advice given to you by your health care provider. Make sure you discuss any questions you have with your health care provider. Document Released: 09/06/2015 Document Revised: 04/29/2016 Document Reviewed: 06/11/2015 Elsevier Interactive Patient Education  2017 [place] Prevention in the Home Falls can cause injuries. They can happen to people of all ages. There are many things you can do to make your home safe and to help prevent falls. What can I do on the outside of my home? Regularly fix the edges of walkways and driveways and fix any cracks. Remove anything that might make you trip as you walk through a door, such as a raised step or threshold. Trim any bushes or trees on the path to your home. Use bright outdoor lighting. Clear any walking paths of anything that might make someone trip, such as rocks or tools. Regularly check to see if handrails are loose or broken. Make sure that both sides of any steps have handrails. Any raised decks and porches should have guardrails on the edges. Have any leaves, snow, or ice cleared regularly. Use sand or salt on walking paths during winter. Clean up any spills in your garage right away. This includes oil or grease spills. What can I do in the bathroom? Use night lights. Install grab bars by the toilet and in the tub and shower. Do not use towel bars as grab bars. Use non-skid mats or decals in the tub or shower. If you need to sit down in the shower, use a plastic, non-slip stool. Keep the floor dry. Clean up any water that spills on the floor as soon as it happens. Remove soap buildup in the tub or shower regularly. Attach bath mats securely with double-sided non-slip rug tape. Do not have throw rugs and other  things on the floor that can make you trip. What can I do in the bedroom? Use night lights. Make sure that you have a light by your bed that is easy to reach. Do not use any sheets or blankets that are too big for your bed. They should not hang down onto the floor. Have a firm chair that has side arms. You can use this for support while you get dressed. Do not have throw rugs and other things on the floor that can make you trip. What can I do in the kitchen? Clean up any spills right away. Avoid walking on wet floors. Keep items that you use a lot in easy-to-reach places. If you need to reach something above you, use a strong step stool that has a grab bar. Keep electrical cords out of the way. Do not use floor polish or wax that makes floors slippery. If you must use wax, use non-skid floor wax. Do not have throw rugs and other things on the floor that can make you trip. What can I do with my stairs? Do not leave any items on the stairs. Make sure that there are handrails on both sides of the stairs and use them. Fix handrails that are broken or loose. Make sure that handrails are as long as the stairways. Check any carpeting to make sure that it is firmly attached to the stairs. Fix any carpet that is loose or worn. Avoid having  throw rugs at the top or bottom of the stairs. If you do have throw rugs, attach them to the floor with carpet tape. Make sure that you have a light switch at the top of the stairs and the bottom of the stairs. If you do not have them, ask someone to add them for you. What else can I do to help prevent falls? Wear shoes that: Do not have high heels. Have rubber bottoms. Are comfortable and fit you well. Are closed at the toe. Do not wear sandals. If you use a stepladder: Make sure that it is fully opened. Do not climb a closed stepladder. Make sure that both sides of the stepladder are locked into place. Ask someone to hold it for you, if possible. Clearly  mark and make sure that you can see: Any grab bars or handrails. First and last steps. Where the edge of each step is. Use tools that help you move around (mobility aids) if they are needed. These include: Canes. Walkers. Scooters. Crutches. Turn on the lights when you go into a dark area. Replace any light bulbs as soon as they burn out. Set up your furniture so you have a clear path. Avoid moving your furniture around. If any of your floors are uneven, fix them. If there are any pets around you, be aware of where they are. Review your medicines with your doctor. Some medicines can make you feel dizzy. This can increase your chance of falling. Ask your doctor what other things that you can do to help prevent falls. This information is not intended to replace advice given to you by your health care provider. Make sure you discuss any questions you have with your health care provider. Document Released: 06/06/2009 Document Revised: 01/16/2016 Document Reviewed: 09/14/2014 Elsevier Interactive Patient Education  2017 [employer].

## 2021-09-15 NOTE — Telephone Encounter (Signed)
No I do not think he needs an OV right now. If he has not improved by the end of the week, he should come in

## 2021-09-15 NOTE — Progress Notes (Signed)
I connected with  [name] today via telehealth video enabled device and verified that I am speaking with the correct person using two identifiers.   Location: Patient: home Provider: work  Persons participating in virtual visit: Ewen Varnell, Glenna Durand LPN  I discussed the limitations, risks, security and privacy concerns of performing an evaluation and management service by video and the availability of in person appointments. The patient expressed understanding and agreed to proceed.   Some vital signs may be absent or patient reported.     Subjective:   [name] is a 76 y.o. male who presents for Medicare Annual/Subsequent preventive examination.  Review of Systems     Cardiac Risk Factors include: advanced age (>1men, >78 women);dyslipidemia;hypertension;male gender     Objective:    Today's Vitals   09/15/21 1601  Weight: 162 lb (73.5 kg)  Height: 6\' 1"  (1.854 m)   Body mass index is 21.37 kg/m.  Advanced Directives 09/15/2021 11/20/2020 09/12/2020 09/28/2017 08/02/2014 08/02/2014 07/30/2014  Does Patient Have a Medical Advance Directive? Yes No Yes No Yes - Yes  Type of Paramedic of [place];Living will - [place];Living will - [place];Living will [place];Living will [place];Living will  Does patient want to make changes to medical advance directive? - - No - Patient declined - - - No - Patient declined  Copy of [place] in Chart? No - copy requested - No - copy requested - No - copy requested - No - copy requested  Would patient like information on creating a medical advance directive? - No - Guardian declined - No - Patient declined - - -    Current Medications (verified) Outpatient Encounter Medications as of 09/15/2021  Medication Sig   fexofenadine-pseudoephedrine (ALLEGRA-D 24) 180-240 MG 24 hr tablet Take 1 tablet by mouth  daily.   Tadalafil 2.5 MG TABS    Triamcinolone Acetonide (TRIAMCINOLONE 0.1 % CREAM : EUCERIN) CREA One application BID topically to each ear canal   atorvastatin (LIPITOR) 20 MG tablet Take 1 tablet (20 mg total) by mouth daily. *labs required for future refills (Patient not taking: Reported on 09/15/2021)   cephALEXin (KEFLEX) 500 MG capsule Take 1 capsule (500 mg total) by mouth 4 (four) times daily. (Patient not taking: Reported on 09/15/2021)   ketoconazole (NIZORAL) 2 % cream Apply 1 application topically 2 (two) times daily as needed for irritation.   metoprolol succinate (TOPROL-XL) 50 MG 24 hr tablet TAKE 1 TABLET BY MOUTH EVERY DAY WITH OR IMMEDIATELY FOLLOWING A MEAL   No facility-administered encounter medications on file as of 09/15/2021.    Allergies (verified) Lisinopril, Nitrofurantoin, Penicillins, Doxycycline, and Levaquin [levofloxacin]   History: Past Medical History:  Diagnosis Date   Arthritis    hands and spine    Asthma    as a child    Elevated PSA    Erectile dysfunction    History of basal cell carcinoma excision    x2  scalp and head   History of epididymitis    Male stress incontinence    Pneumonia    hx of in 1968   Prostate cancer ([place]) 05/18/14   Gleason 4+3=7   Wears contact lenses    White coat hypertension    Past Surgical History:  Procedure Laterality Date   COLONOSCOPY  02-19-2004   LYMPHADENECTOMY Bilateral 08/02/2014   Procedure: LYMPHADENECTOMY;  Surgeon: Raynelle Bring, MD;  Location: [facility];  Service: Urology;  Laterality: Bilateral;   PROSTATE BIOPSY N/A 05/18/2014   Procedure: TRANSRECTAL ULTRASOUND OF THE PROSTATE/BIOPSY;  Surgeon: Ardis Hughs, MD;  Location[facility];  Service: Urology;  Laterality: N/A;   ROBOT ASSISTED LAPAROSCOPIC RADICAL PROSTATECTOMY N/A 08/02/2014   Procedure: ROBOTIC ASSISTED LAPAROSCOPIC RADICAL PROSTATECTOMY LEVEL 2;  Surgeon: Raynelle Bring, MD;  Location: [facility];  Service: Urology;   Laterality: N/A;   Family History  Problem Relation Age of Onset   Dementia Mother    Stroke Father    Cancer Sister        breast, twin sister   Dementia Maternal Aunt    Dementia Maternal Uncle    Social History   Socioeconomic History   Marital status: Married    Spouse name: Not on file   Number of children: Not on file   Years of education: Not on file   Highest education level: Not on file  Occupational History   Not on file  Tobacco Use   Smoking status: Never   Smokeless tobacco: Never  Vaping Use   Vaping Use: Never used  Substance and Sexual Activity   Alcohol use: Not Currently   Drug use: No   Sexual activity: Not on file  Other Topics Concern   Not on file  Social History Narrative   Not on file   Social Determinants of Health   Financial Resource Strain: Low Risk    Difficulty of Paying Living Expenses: Not hard at all  Food Insecurity: No Food Insecurity   Worried About Charity fundraiser in the Last Year: Never true   [place] in the Last Year: Never true  Transportation Needs: No Transportation Needs   Lack of Transportation (Medical): No   Lack of Transportation (Non-Medical): No  Physical Activity: Sufficiently Active   Days of Exercise per Week: 4 days   Minutes of Exercise per Session: 60 min  Stress: No Stress Concern Present   Feeling of Stress : Not at all  Social Connections: Not on file    Tobacco Counseling Counseling given: Not Answered   Clinical Intake:  Pre-visit preparation completed: Yes  Pain : No/denies pain     Nutritional Status: BMI of 19-24  Normal Nutritional Risks: None Diabetes: No  How often do you need to have someone help you when you read instructions, pamphlets, or other written materials from your doctor or pharmacy?: 1 - Never What is the last grade level you completed in school?: 19yrs college  Diabetic? no  Interpreter Needed?: No  Information entered by :: NAllen LPN   Activities  of Daily Living In your present state of health, do you have any difficulty performing the following activities: 09/15/2021 09/15/2021  Hearing? N N  Vision? N N  Difficulty concentrating or making decisions? Tempie Donning  Walking or climbing stairs? N N  Dressing or bathing? N N  Doing errands, shopping? N N  Preparing Food and eating ? N N  Using the Toilet? N N  In the past six months, have you accidently leaked urine? Y Y  Comment since prostate cancer -  Do you have problems with loss of bowel control? N N  Managing your Medications? N N  Managing your Finances? N N  Housekeeping or managing your Housekeeping? N N  Some recent data might be hidden    Patient Care Team: Laurey Morale, MD as PCP - General  Indicate any recent Medical Services you  may have received from other than [facility] providers in the past year (date may be approximate).     Assessment:   This is a routine wellness examination for [name].  Hearing/Vision screen Vision Screening - Comments:: Regular eye exams,[facility]  Dietary issues and exercise activities discussed: Current Exercise Habits: Structured exercise class, Type of exercise: yoga;Other - see comments (personal trainer), Time (Minutes): 60, Frequency (Times/Week): 4, Weekly Exercise (Minutes/Week): 240   Goals Addressed             This Visit's Progress    Patient Stated       09/15/2021, get over covid       Depression Screen PHQ 2/9 Scores 09/15/2021 09/12/2020 10/11/2019 10/13/2017 06/07/2014 02/27/2014  PHQ - 2 Score 0 0 0 1 0 0    Fall Risk Fall Risk  09/15/2021 09/15/2021 09/12/2020 10/11/2019 10/13/2017  Falls in the past year? 0 0 0 0 No  Comment - - - - -  Number falls in past yr: - - 0 0 -  Injury with Fall? - - 0 0 -  Risk for fall due to : Medication side effect - No Fall Risks - -  Follow up Falls evaluation completed;Education provided;Falls prevention discussed - Falls evaluation completed;Falls prevention discussed Falls  evaluation completed -    FALL RISK PREVENTION PERTAINING TO THE HOME:  Any stairs in or around the home? Yes  If so, are there any without handrails? No  Home free of loose throw rugs in walkways, pet beds, electrical cords, etc? Yes  Adequate lighting in your home to reduce risk of falls? Yes   ASSISTIVE DEVICES UTILIZED TO PREVENT FALLS:  Life alert? No  Use of a cane, walker or w/c? No  Grab bars in the bathroom? No  Shower chair or bench in shower? No  Elevated toilet seat or a handicapped toilet? Yes   TIMED UP AND GO:  Was the test performed? No .      Cognitive Function:     6CIT Screen 09/12/2020  What Year? 0 points  What month? 0 points  What time? 0 points  Count back from 20 0 points  Months in reverse 0 points  Repeat phrase 2 points  Total Score 2    Immunizations Immunization History  Administered Date(s) Administered   Fluad Quad(high Dose 65+) 04/24/2019   Hepatitis A, Adult 01/08/2014, 07/26/2014   Influenza, High Dose Seasonal PF 06/15/2017, 05/28/2020, 06/06/2021   Influenza-Unspecified 06/16/2014, 05/06/2015, 05/02/2018, 04/25/2019   PFIZER Comirnaty(Gray Top)Covid-19 Tri-Sucrose Vaccine 12/12/2020   PFIZER(Purple Top)SARS-COV-2 Vaccination 09/30/2019, 10/24/2019, 05/28/2020   Pneumococcal Conjugate-13 07/09/2017   Pneumococcal Polysaccharide-23 05/04/2019   Td 10/11/2019    TDAP status: Up to date  Flu Vaccine status: Up to date  Pneumococcal vaccine status: Up to date  Covid-19 vaccine status: Completed vaccines  Qualifies for Shingles Vaccine? Yes   Zostavax completed No   Shingrix Completed?: No.    Education has been provided regarding the importance of this vaccine. Patient has been advised to call insurance company to determine out of pocket expense if they have not yet received this vaccine. Advised may also receive vaccine at local pharmacy or Health Dept. Verbalized acceptance and understanding.  Screening Tests Health  Maintenance  Topic Date Due   Hepatitis C Screening  Never done   Zoster Vaccines- Shingrix (1 of 2) Never done   COVID-19 Vaccine (5 - Booster for Pfizer series) 02/06/2021   Fecal DNA (Cologuard)  11/29/2023  TETANUS/TDAP  10/10/2029   Pneumonia Vaccine 8+ Years old  Completed   INFLUENZA VACCINE  Completed   HPV VACCINES  Aged Out   COLONOSCOPY (Pts 45-38yrs Insurance coverage will need to be confirmed)  Discontinued    Health Maintenance  Health Maintenance Due  Topic Date Due   Hepatitis C Screening  Never done   Zoster Vaccines- Shingrix (1 of 2) Never done   COVID-19 Vaccine (5 - Booster for Pfizer series) 02/06/2021    Colorectal cancer screening: Type of screening: Cologuard. Completed 11/28/2020. Repeat every 3 years  Lung Cancer Screening: (Low Dose CT Chest recommended if Age 62-80 years, 30 pack-year currently smoking OR have quit w/in 15years.) does not qualify.   Lung Cancer Screening Referral: no  Additional Screening:  Hepatitis C Screening: does qualify;  Vision Screening: Recommended annual ophthalmology exams for early detection of glaucoma and other disorders of the eye. Is the patient up to date with their annual eye exam?  Yes  Who is the provider or what is the name of the office in which the patient attends annual eye exams? [facility] If pt is not established with a provider, would they like to be referred to a provider to establish care? No .   Dental Screening: Recommended annual dental exams for proper oral hygiene  Community Resource Referral / Chronic Care Management: CRR required this visit?  No   CCM required this visit?  No      Plan:     I have personally reviewed and noted the following in the patients chart:   Medical and social history Use of alcohol, tobacco or illicit drugs  Current medications and supplements including opioid prescriptions. Patient is not currently taking opioid prescriptions. Functional  ability and status Nutritional status Physical activity Advanced directives List of other physicians Hospitalizations, surgeries, and ER visits in previous 12 months Vitals Screenings to include cognitive, depression, and falls Referrals and appointments  In addition, I have reviewed and discussed with patient certain preventive protocols, quality metrics, and best practice recommendations. A written personalized care plan for preventive services as well as general preventive health recommendations were provided to patient.     Kellie Simmering, LPN   1/00/7121   Nurse Notes: 6 CIT not administered. Patient is part of an Alzheimer's study.

## 2021-09-18 NOTE — Telephone Encounter (Signed)
Spoke with pt stated that he [place] feeling better, states that he will call the office to schedule appointment  if he needs to see Dr Sarajane Jews

## 2021-10-08 DIAGNOSIS — Z8546 Personal history of malignant neoplasm of prostate: Secondary | ICD-10-CM | POA: Diagnosis not present

## 2021-10-15 DIAGNOSIS — Z8546 Personal history of malignant neoplasm of prostate: Secondary | ICD-10-CM | POA: Diagnosis not present

## 2021-10-15 DIAGNOSIS — N393 Stress incontinence (female) (male): Secondary | ICD-10-CM | POA: Diagnosis not present

## 2021-10-15 DIAGNOSIS — N5201 Erectile dysfunction due to arterial insufficiency: Secondary | ICD-10-CM | POA: Diagnosis not present

## 2021-10-29 DIAGNOSIS — M62838 Other muscle spasm: Secondary | ICD-10-CM | POA: Diagnosis not present

## 2021-10-29 DIAGNOSIS — N393 Stress incontinence (female) (male): Secondary | ICD-10-CM | POA: Diagnosis not present

## 2021-10-29 DIAGNOSIS — M6281 Muscle weakness (generalized): Secondary | ICD-10-CM | POA: Diagnosis not present

## 2021-11-06 DIAGNOSIS — Z85828 Personal history of other malignant neoplasm of skin: Secondary | ICD-10-CM | POA: Diagnosis not present

## 2021-11-06 DIAGNOSIS — L309 Dermatitis, unspecified: Secondary | ICD-10-CM | POA: Diagnosis not present

## 2021-11-06 DIAGNOSIS — D1801 Hemangioma of skin and subcutaneous tissue: Secondary | ICD-10-CM | POA: Diagnosis not present

## 2021-11-12 DIAGNOSIS — M6281 Muscle weakness (generalized): Secondary | ICD-10-CM | POA: Diagnosis not present

## 2021-11-12 DIAGNOSIS — R300 Dysuria: Secondary | ICD-10-CM | POA: Diagnosis not present

## 2021-11-12 DIAGNOSIS — M62838 Other muscle spasm: Secondary | ICD-10-CM | POA: Diagnosis not present

## 2021-11-12 DIAGNOSIS — N393 Stress incontinence (female) (male): Secondary | ICD-10-CM | POA: Diagnosis not present

## 2021-11-17 DIAGNOSIS — H25013 Cortical age-related cataract, bilateral: Secondary | ICD-10-CM | POA: Diagnosis not present

## 2021-11-26 DIAGNOSIS — M62838 Other muscle spasm: Secondary | ICD-10-CM | POA: Diagnosis not present

## 2021-11-26 DIAGNOSIS — R350 Frequency of micturition: Secondary | ICD-10-CM | POA: Diagnosis not present

## 2021-11-26 DIAGNOSIS — N393 Stress incontinence (female) (male): Secondary | ICD-10-CM | POA: Diagnosis not present

## 2021-11-26 DIAGNOSIS — M6281 Muscle weakness (generalized): Secondary | ICD-10-CM | POA: Diagnosis not present

## 2021-12-01 DIAGNOSIS — Z23 Encounter for immunization: Secondary | ICD-10-CM | POA: Diagnosis not present

## 2021-12-09 DIAGNOSIS — M7661 Achilles tendinitis, right leg: Secondary | ICD-10-CM | POA: Diagnosis not present

## 2021-12-10 DIAGNOSIS — R350 Frequency of micturition: Secondary | ICD-10-CM | POA: Diagnosis not present

## 2021-12-10 DIAGNOSIS — M62838 Other muscle spasm: Secondary | ICD-10-CM | POA: Diagnosis not present

## 2021-12-10 DIAGNOSIS — N393 Stress incontinence (female) (male): Secondary | ICD-10-CM | POA: Diagnosis not present

## 2021-12-10 DIAGNOSIS — M6281 Muscle weakness (generalized): Secondary | ICD-10-CM | POA: Diagnosis not present

## 2021-12-10 DIAGNOSIS — R300 Dysuria: Secondary | ICD-10-CM | POA: Diagnosis not present

## 2022-01-05 DIAGNOSIS — M7661 Achilles tendinitis, right leg: Secondary | ICD-10-CM | POA: Diagnosis not present

## 2022-02-05 ENCOUNTER — Ambulatory Visit (INDEPENDENT_AMBULATORY_CARE_PROVIDER_SITE_OTHER): Payer: Medicare Other | Admitting: Family Medicine

## 2022-02-05 ENCOUNTER — Encounter: Payer: Self-pay | Admitting: Family Medicine

## 2022-02-05 VITALS — BP 112/62 | HR 57 | Temp 98.5°F | Wt 176.1 lb

## 2022-02-05 DIAGNOSIS — J019 Acute sinusitis, unspecified: Secondary | ICD-10-CM | POA: Diagnosis not present

## 2022-02-05 DIAGNOSIS — R059 Cough, unspecified: Secondary | ICD-10-CM | POA: Diagnosis not present

## 2022-02-05 DIAGNOSIS — R0981 Nasal congestion: Secondary | ICD-10-CM

## 2022-02-05 LAB — POCT INFLUENZA A/B
Influenza A, POC: NEGATIVE
Influenza B, POC: NEGATIVE

## 2022-02-05 LAB — POC COVID19 BINAXNOW: SARS Coronavirus 2 Ag: NEGATIVE

## 2022-02-05 MED ORDER — AZITHROMYCIN 250 MG PO TABS: ORAL_TABLET | ORAL | 0 refills | Status: DC

## 2022-02-05 NOTE — Progress Notes (Signed)
   Subjective:    Patient ID: [name], male    DOB: 11/06/1945, 76 y.o.   MRN: [id number]  HPI Here for 10 days of sinus congestion, ear pressure, PND, and a dry cough. No fever. On Allegra D. He just returned from a cruise to [place] countries.    Review of Systems  Constitutional: Negative.   HENT:  Positive for congestion, ear pain, postnasal drip and sinus pressure. Negative for sore throat.   Eyes: Negative.   Respiratory:  Positive for cough and chest tightness. Negative for shortness of breath and wheezing.   Gastrointestinal: Negative.        Objective:   Physical Exam Constitutional:      Appearance: Normal appearance. He is not ill-appearing.  HENT:     Right Ear: Tympanic membrane, ear canal and external ear normal.     Left Ear: Tympanic membrane, ear canal and external ear normal.     Nose: Nose normal.     Mouth/Throat:     Pharynx: Oropharynx is clear.  Eyes:     Conjunctiva/sclera: Conjunctivae normal.  Pulmonary:     Effort: Pulmonary effort is normal.     Breath sounds: Normal breath sounds.  Neurological:     Mental Status: He is alert.           Assessment & Plan:  Sinusitis, treat with a Zpack. Alysia Penna, MD

## 2022-03-02 DIAGNOSIS — N390 Urinary tract infection, site not specified: Secondary | ICD-10-CM | POA: Diagnosis not present

## 2022-04-24 DIAGNOSIS — G3184 Mild cognitive impairment, so stated: Secondary | ICD-10-CM | POA: Diagnosis not present

## 2022-05-14 DIAGNOSIS — B309 Viral conjunctivitis, unspecified: Secondary | ICD-10-CM | POA: Diagnosis not present

## 2022-05-26 DIAGNOSIS — Z23 Encounter for immunization: Secondary | ICD-10-CM | POA: Diagnosis not present

## 2022-09-08 ENCOUNTER — Other Ambulatory Visit (HOSPITAL_BASED_OUTPATIENT_CLINIC_OR_DEPARTMENT_OTHER): Payer: Self-pay

## 2022-09-08 MED ORDER — ZOSTER VAC RECOMB ADJUVANTED 50 MCG/0.5ML IM SUSR
INTRAMUSCULAR | 0 refills | Status: DC
  Filled 2022-09-08: qty 0.5, 1d supply, fill #0

## 2022-09-09 ENCOUNTER — Other Ambulatory Visit (HOSPITAL_BASED_OUTPATIENT_CLINIC_OR_DEPARTMENT_OTHER): Payer: Self-pay

## 2022-09-21 ENCOUNTER — Ambulatory Visit: Payer: Medicare Other

## 2022-09-28 ENCOUNTER — Telehealth (INDEPENDENT_AMBULATORY_CARE_PROVIDER_SITE_OTHER): Payer: Medicare Other

## 2022-09-28 VITALS — Ht 73.0 in | Wt 167.0 lb

## 2022-09-28 DIAGNOSIS — Z0000 Encounter for general adult medical examination without abnormal findings: Secondary | ICD-10-CM

## 2022-09-28 NOTE — Patient Instructions (Addendum)
[name] , Thank you for taking time to come for your Medicare Wellness Visit. I appreciate your ongoing commitment to your health goals. Please review the following plan we discussed and let me know if I can assist you in the future.   These are the goals we discussed:  Goals       No current goals (pt-stated)      Patient Stated      09/15/2021, get over covid      Weight (lb) < 177 lb (80.3 kg)        This is a list of the screening recommended for you and due dates:  Health Maintenance  Topic Date Due   COVID-19 Vaccine (7 - 2023-24 season) 10/14/2022*   Hepatitis C Screening: USPSTF Recommendation to screen - Ages 18-79 yo.  09/29/2023*   Zoster (Shingles) Vaccine (2 of 2) 11/03/2022   Medicare Annual Wellness Visit  09/29/2023   Cologuard (Stool DNA test)  11/29/2023   DTaP/Tdap/Td vaccine (2 - Tdap) 10/10/2029   Pneumonia Vaccine  Completed   Flu Shot  Completed   HPV Vaccine  Aged Out   Colon Cancer Screening  Discontinued  *Topic was postponed. The date shown is not the original due date.    Advanced directives: Please bring a copy of your health care power of attorney and living will to the office to be added to your chart at your convenience.   Conditions/risks identified: None  Next appointment: Follow up in one year for your annual wellness visit.    Preventive Care 76 Years and Older, Male  Preventive care refers to lifestyle choices and visits with your health care provider that can promote health and wellness. What does preventive care include? A yearly physical exam. This is also called an annual well check. Dental exams once or twice a year. Routine eye exams. Ask your health care provider how often you should have your eyes checked. Personal lifestyle choices, including: Daily care of your teeth and gums. Regular physical activity. Eating a healthy diet. Avoiding tobacco and drug use. Limiting alcohol use. Practicing safe sex. Taking low doses of  aspirin every day. Taking vitamin and mineral supplements as recommended by your health care provider. What happens during an annual well check? The services and screenings done by your health care provider during your annual well check will depend on your age, overall health, lifestyle risk factors, and family history of disease. Counseling  Your health care provider may ask you questions about your: Alcohol use. Tobacco use. Drug use. Emotional well-being. Home and relationship well-being. Sexual activity. Eating habits. History of falls. Memory and ability to understand (cognition). Work and work Statistician. Screening  You may have the following tests or measurements: Height, weight, and BMI. Blood pressure. Lipid and cholesterol levels. These may be checked every 5 years, or more frequently if you are over 18 years old. Skin check. Lung cancer screening. You may have this screening every year starting at age 76 if you have a 30-pack-year history of smoking and currently smoke or have quit within the past 15 years. Fecal occult blood test (FOBT) of the stool. You may have this test every year starting at age 82. Flexible sigmoidoscopy or colonoscopy. You may have a sigmoidoscopy every 5 years or a colonoscopy every 10 years starting at age 22. Prostate cancer screening. Recommendations will vary depending on your family history and other risks. Hepatitis C blood test. Hepatitis B blood test. Sexually transmitted disease (STD) testing.  Diabetes screening. This is done by checking your blood sugar (glucose) after you have not eaten for a while (fasting). You may have this done every 1-3 years. Abdominal aortic aneurysm (AAA) screening. You may need this if you are a current or former smoker. Osteoporosis. You may be screened starting at age 76 if you are at high risk. Talk with your health care provider about your test results, treatment options, and if necessary, the need for more  tests. Vaccines  Your health care provider may recommend certain vaccines, such as: Influenza vaccine. This is recommended every year. Tetanus, diphtheria, and acellular pertussis (Tdap, Td) vaccine. You may need a Td booster every 10 years. Zoster vaccine. You may need this after age 76. Pneumococcal 13-valent conjugate (PCV13) vaccine. One dose is recommended after age 76. Pneumococcal polysaccharide (PPSV23) vaccine. One dose is recommended after age 76. Talk to your health care provider about which screenings and vaccines you need and how often you need them. This information is not intended to replace advice given to you by your health care provider. Make sure you discuss any questions you have with your health care provider. Document Released: 09/06/2015 Document Revised: 04/29/2016 Document Reviewed: 06/11/2015 Elsevier Interactive Patient Education  2017 [place] Prevention in the Home Falls can cause injuries. They can happen to people of all ages. There are many things you can do to make your home safe and to help prevent falls. What can I do on the outside of my home? Regularly fix the edges of walkways and driveways and fix any cracks. Remove anything that might make you trip as you walk through a door, such as a raised step or threshold. Trim any bushes or trees on the path to your home. Use bright outdoor lighting. Clear any walking paths of anything that might make someone trip, such as rocks or tools. Regularly check to see if handrails are loose or broken. Make sure that both sides of any steps have handrails. Any raised decks and porches should have guardrails on the edges. Have any leaves, snow, or ice cleared regularly. Use sand or salt on walking paths during winter. Clean up any spills in your garage right away. This includes oil or grease spills. What can I do in the bathroom? Use night lights. Install grab bars by the toilet and in the tub and shower.  Do not use towel bars as grab bars. Use non-skid mats or decals in the tub or shower. If you need to sit down in the shower, use a plastic, non-slip stool. Keep the floor dry. Clean up any water that spills on the floor as soon as it happens. Remove soap buildup in the tub or shower regularly. Attach bath mats securely with double-sided non-slip rug tape. Do not have throw rugs and other things on the floor that can make you trip. What can I do in the bedroom? Use night lights. Make sure that you have a light by your bed that is easy to reach. Do not use any sheets or blankets that are too big for your bed. They should not hang down onto the floor. Have a firm chair that has side arms. You can use this for support while you get dressed. Do not have throw rugs and other things on the floor that can make you trip. What can I do in the kitchen? Clean up any spills right away. Avoid walking on wet floors. Keep items that you use a lot in easy-to-reach  places. If you need to reach something above you, use a strong step stool that has a grab bar. Keep electrical cords out of the way. Do not use floor polish or wax that makes floors slippery. If you must use wax, use non-skid floor wax. Do not have throw rugs and other things on the floor that can make you trip. What can I do with my stairs? Do not leave any items on the stairs. Make sure that there are handrails on both sides of the stairs and use them. Fix handrails that are broken or loose. Make sure that handrails are as long as the stairways. Check any carpeting to make sure that it is firmly attached to the stairs. Fix any carpet that is loose or worn. Avoid having throw rugs at the top or bottom of the stairs. If you do have throw rugs, attach them to the floor with carpet tape. Make sure that you have a light switch at the top of the stairs and the bottom of the stairs. If you do not have them, ask someone to add them for you. What else  can I do to help prevent falls? Wear shoes that: Do not have high heels. Have rubber bottoms. Are comfortable and fit you well. Are closed at the toe. Do not wear sandals. If you use a stepladder: Make sure that it is fully opened. Do not climb a closed stepladder. Make sure that both sides of the stepladder are locked into place. Ask someone to hold it for you, if possible. Clearly mark and make sure that you can see: Any grab bars or handrails. First and last steps. Where the edge of each step is. Use tools that help you move around (mobility aids) if they are needed. These include: Canes. Walkers. Scooters. Crutches. Turn on the lights when you go into a dark area. Replace any light bulbs as soon as they burn out. Set up your furniture so you have a clear path. Avoid moving your furniture around. If any of your floors are uneven, fix them. If there are any pets around you, be aware of where they are. Review your medicines with your doctor. Some medicines can make you feel dizzy. This can increase your chance of falling. Ask your doctor what other things that you can do to help prevent falls. This information is not intended to replace advice given to you by your health care provider. Make sure you discuss any questions you have with your health care provider. Document Released: 06/06/2009 Document Revised: 01/16/2016 Document Reviewed: 09/14/2014 Elsevier Interactive Patient Education  2017 [employer].

## 2022-09-28 NOTE — Progress Notes (Signed)
Subjective:   [name] is a 77 y.o. male who presents for Medicare Annual/Subsequent preventive examination.  Review of Systems    Virtual Visit via Telephone Note  I connected with  [name] on 09/28/22 at 12:30 PM EST by telephone and verified that I am speaking with the correct person using two identifiers.  Location: Patient: Home Provider: Office Persons participating in the virtual visit: patient/Nurse Health Advisor   I discussed the limitations, risks, security and privacy concerns of performing an evaluation and management service by telephone and the availability of in person appointments. The patient expressed understanding and agreed to proceed.  Interactive audio and video telecommunications were attempted between this nurse and patient, however failed, due to patient having technical difficulties OR patient did not have access to video capability.  We continued and completed visit with audio only.  Some vital signs may be absent or patient reported.   Michael Peaches, LPN  Cardiac Risk Factors include: advanced age (>35mn, >>88women);male gender;hypertension;dyslipidemia     Objective:    Today's Vitals   09/28/22 1232  Weight: 167 lb (75.8 kg)  Height: '6\' 1"'$  (1.854 m)   Body mass index is 22.03 kg/m.     09/28/2022   12:40 PM 09/15/2021    4:07 PM 11/20/2020   12:32 PM 09/12/2020    9:56 AM 09/28/2017    9:53 PM 08/02/2014    3:58 PM 08/02/2014    9:38 AM  Advanced Directives  Does Patient Have a Medical Advance Directive? Yes Yes No Yes No Yes   Type of AParamed[place]of [place] will [place] will  [place] will  [place] will [place] will  Does patient want to make changes to medical advance directive?    No - Patient declined     Copy of [place] Chart? No - copy requested No - copy  requested  No - copy requested  No - copy requested   Would patient like information on creating a medical advance directive?   No - Guardian declined  No - Patient declined      Current Medications (verified) Outpatient Encounter Medications as of 09/28/2022  Medication Sig   fexofenadine-pseudoephedrine (ALLEGRA-D 24) 180-240 MG 24 hr tablet Take 1 tablet by mouth daily.   Tadalafil 2.5 MG TABS    Triamcinolone Acetonide (TRIAMCINOLONE 0.1 % CREAM : EUCERIN) CREA One application BID topically to each ear canal   Zoster Vaccine Adjuvanted ([facility] injection Inject into the muscle.   [DISCONTINUED] atorvastatin (LIPITOR) 20 MG tablet Take 1 tablet (20 mg total) by mouth daily. *labs required for future refills (Patient not taking: Reported on 09/15/2021)   [DISCONTINUED] azithromycin (ZITHROMAX Z-PAK) 250 MG tablet As directed   No facility-administered encounter medications on file as of 09/28/2022.    Allergies (verified) Lisinopril, Nitrofurantoin, Penicillins, Doxycycline, and Levaquin [levofloxacin]   History: Past Medical History:  Diagnosis Date   Arthritis    hands and spine    Asthma    as a child    Elevated PSA    Erectile dysfunction    History of basal cell carcinoma excision    x2  scalp and head   History of epididymitis    Male stress incontinence    Pneumonia    hx of in 1968   Prostate cancer ([facility] 05/18/14   Gleason 4+3=7   Wears contact lenses  White coat hypertension    Past Surgical History:  Procedure Laterality Date   COLONOSCOPY  02-19-2004   LYMPHADENECTOMY Bilateral 08/02/2014   Procedure: LYMPHADENECTOMY;  Surgeon: Raynelle Bring, MD;  Location: [facility];  Service: Urology;  Laterality: Bilateral;   PROSTATE BIOPSY N/A 05/18/2014   Procedure: TRANSRECTAL ULTRASOUND OF THE PROSTATE/BIOPSY;  Surgeon: Ardis Hughs, MD;  [facility];  Service: Urology;  Laterality: N/A;   ROBOT ASSISTED LAPAROSCOPIC RADICAL PROSTATECTOMY  N/A 08/02/2014   Procedure: ROBOTIC ASSISTED LAPAROSCOPIC RADICAL PROSTATECTOMY LEVEL 2;  Surgeon: Raynelle Bring, MD;  Location: [facility];  Service: Urology;  Laterality: N/A;   Family History  Problem Relation Age of Onset   Dementia Mother    Stroke Father    Cancer Sister        breast, twin sister   Dementia Maternal Aunt    Dementia Maternal Uncle    Social History   Socioeconomic History   Marital status: Married    Spouse name: Not on file   Number of children: Not on file   Years of education: Not on file   Highest education level: Not on file  Occupational History   Not on file  Tobacco Use   Smoking status: Never   Smokeless tobacco: Never  Vaping Use   Vaping Use: Never used  Substance and Sexual Activity   Alcohol use: Not Currently   Drug use: No   Sexual activity: Not on file  Other Topics Concern   Not on file  Social History Narrative   Not on file   Social Determinants of Health   Financial Resource Strain: Low Risk  (09/28/2022)   Overall Financial Resource Strain (CARDIA)    Difficulty of Paying Living Expenses: Not hard at all  Food Insecurity: No Food Insecurity (09/28/2022)   Hunger Vital Sign    Worried About Running Out of Food in the Last Year: Never true    [place] in the Last Year: Never true  Transportation Needs: No Transportation Needs (09/28/2022)   PRAPARE - Hydrologist (Medical): No    Lack of Transportation (Non-Medical): No  Physical Activity: Insufficiently Active (09/28/2022)   Exercise Vital Sign    Days of Exercise per Week: 2 days    Minutes of Exercise per Session: 40 min  Stress: No Stress Concern Present (09/28/2022)   [place]    Feeling of Stress : Not at all  Social Connections: [place] (09/28/2022)   Social Connection and Isolation Panel [NHANES]    Frequency of Communication with Friends and Family: More than  three times a week    Frequency of Social Gatherings with Friends and Family: More than three times a week    Attends Religious Services: More than 4 times per year    Active Member of [employer] or Organizations: Yes    Attends Music therapist: More than 4 times per year    Marital Status: Married    Tobacco Counseling Counseling given: Not Answered   Clinical Intake:  Pre-visit preparation completed: No  Pain : No/denies pain     BMI - recorded: 22.03 Nutritional Status: BMI of 19-24  Normal Nutritional Risks: None Diabetes: No  How often do you need to have someone help you when you read instructions, pamphlets, or other written materials from your doctor or pharmacy?: 1 - Never  Diabetic?  No  Interpreter Needed?:  No  Information entered by :: Rolene Arbour LPN   Activities of Daily Living    09/28/2022   12:38 PM  In your present state of health, do you have any difficulty performing the following activities:  Hearing? 1  Comment Wears hearing aids  Vision? 0  Difficulty concentrating or making decisions? 0  Walking or climbing stairs? 0  Dressing or bathing? 0  Doing errands, shopping? 0  Preparing Food and eating ? N  Using the Toilet? N  In the past six months, have you accidently leaked urine? Y  Comment Wears pads. Followed by Urologist  Do you have problems with loss of bowel control? N  Managing your Medications? N  Managing your Finances? N  Housekeeping or managing your Housekeeping? N    Patient Care Team: Laurey Morale, MD as PCP - General  Indicate any recent Medical Services you may have received from other than [facility] providers in the past year (date may be approximate).     Assessment:   This is a routine wellness examination for [name].  Hearing/Vision screen Hearing Screening - Comments:: Wears hearing aids Vision Screening - Comments:: Wears rx glasses - up to date with routine eye exams with  Dr Nicki Reaper  Dietary issues  and exercise activities discussed: Current Exercise Habits: Home exercise routine, Type of exercise: walking, Time (Minutes): 40, Frequency (Times/Week): 3, Weekly Exercise (Minutes/Week): 120, Intensity: Moderate, Exercise limited by: None identified   Goals Addressed               This Visit's Progress     No current goals (pt-stated)         Depression Screen    09/28/2022   12:37 PM 02/05/2022    9:54 AM 09/15/2021    4:09 PM 09/12/2020    9:57 AM 10/11/2019    8:54 AM 10/13/2017    1:02 PM 06/07/2014    8:48 AM  PHQ 2/9 Scores  PHQ - 2 Score 0 0 0 0 0 1 0  PHQ- 9 Score  0         Fall Risk    09/28/2022   12:40 PM 09/27/2022    1:58 PM 02/05/2022    9:54 AM 09/15/2021    4:08 PM 09/15/2021   12:31 PM  [place] in the past year? 0 0 0 0 0  Number falls in past yr: 0  0    Injury with Fall? 0  0    Risk for fall due to : No Fall Risks  No Fall Risks Medication side effect   Follow up Falls prevention discussed  Falls evaluation completed Falls evaluation completed;Education provided;Falls prevention discussed     FALL RISK PREVENTION PERTAINING TO THE HOME:  Any stairs in or around the home? Yes  If so, are there any without handrails? No  Home free of loose throw rugs in walkways, pet beds, electrical cords, etc? Yes  Adequate lighting in your home to reduce risk of falls? Yes   ASSISTIVE DEVICES UTILIZED TO PREVENT FALLS:  Life alert? No  Use of a cane, walker or w/c? No  Grab bars in the bathroom? No  Shower chair or bench in shower? No  Elevated toilet seat or a handicapped toilet? No   TIMED UP AND GO:  Was the test performed? No . Audio Visit   Cognitive Function:        09/28/2022   12:40 PM 09/12/2020    9:59  AM  6CIT Screen  What Year? 0 points 0 points  What month? 0 points 0 points  What time? 0 points 0 points  Count back from 20 0 points 0 points  Months in reverse 0 points 0 points  Repeat phrase 4 points 2 points  Total Score 4  points 2 points    Immunizations Immunization History  Administered Date(s) Administered   Fluad Quad(high Dose 65+) 04/24/2019   Hepatitis A, Adult 01/08/2014, 07/26/2014   Influenza, High Dose Seasonal PF 06/15/2017, 05/28/2020, 06/06/2021, 05/26/2022   Influenza-Unspecified 06/16/2014, 05/06/2015, 05/02/2018, 04/25/2019   Moderna Covid-19 Vaccine Bivalent Booster 27yr & up 05/26/2022   PFIZER Comirnaty(Gray Top)Covid-19 Tri-Sucrose Vaccine 12/12/2020   PFIZER(Purple Top)SARS-COV-2 Vaccination 09/30/2019, 10/24/2019, 05/28/2020   Pfizer Covid-19 Vaccine Bivalent Booster 159yr& up 12/01/2021   Pneumococcal Conjugate-13 07/09/2017   Pneumococcal Polysaccharide-23 05/04/2019   Td 10/11/2019   Zoster Recombinat (Shingrix) 09/08/2022    TDAP status: Up to date  Flu Vaccine status: Up to date  Pneumococcal vaccine status: Up to date  Covid-19 vaccine status: Completed vaccines  Qualifies for Shingles Vaccine? Yes   Zostavax completed Yes   Shingrix Completed?: Yes  Screening Tests Health Maintenance  Topic Date Due   COVID-19 Vaccine (7 - 2023-24 season) 10/14/2022 (Originally 07/21/2022)   Hepatitis C Screening  09/29/2023 (Originally 12/17/1963)   Zoster Vaccines- Shingrix (2 of 2) 11/03/2022   Medicare Annual Wellness (AWV)  09/29/2023   Fecal DNA (Cologuard)  11/29/2023   DTaP/Tdap/Td (2 - Tdap) 10/10/2029   Pneumonia Vaccine 77Years old  Completed   INFLUENZA VACCINE  Completed   HPV VACCINES  Aged Out   COLONOSCOPY (Pts [id number] coverage will need to be confirmed)  Discontinued    Health Maintenance  There are no preventive care reminders to display for this patient.   Colorectal cancer screening: No longer required.   Lung Cancer Screening: (Low Dose CT Chest recommended if Age 52-15-80ars, 30 pack-year currently smoking OR have quit w/in 15years.) does not qualify.     Additional Screening:  Hepatitis C Screening: does qualify;  Deferred  Vision Screening: Recommended annual ophthalmology exams for early detection of glaucoma and other disorders of the eye. Is the patient up to date with their annual eye exam?  Yes  Who is the provider or what is the name of the office in which the patient attends annual eye exams? Dr ScoNicki Reaper pt is not established with a provider, would they like to be referred to a provider to establish care? No .   Dental Screening: Recommended annual dental exams for proper oral hygiene  Community Resource Referral / Chronic Care Management:  CRR required this visit?  No   CCM required this visit?  No      Plan:     I have personally reviewed and noted the following in the patient's chart:   Medical and social history Use of alcohol, tobacco or illicit drugs  Current medications and supplements including opioid prescriptions. Patient is not currently taking opioid prescriptions. Functional ability and status Nutritional status Physical activity Advanced directives List of other physicians Hospitalizations, surgeries, and ER visits in previous 12 months Vitals Screenings to include cognitive, depression, and falls Referrals and appointments  In addition, I have reviewed and discussed with patient certain preventive protocols, quality metrics, and best practice recommendations. A written personalized care plan for preventive services as well as general preventive health recommendations were provided to patient.     [name]  Michael Levy, LPN   0/04/2956   Nurse Notes: Patient due Hep-C Screening

## 2022-10-09 DIAGNOSIS — Z8546 Personal history of malignant neoplasm of prostate: Secondary | ICD-10-CM | POA: Diagnosis not present

## 2022-10-16 DIAGNOSIS — N5201 Erectile dysfunction due to arterial insufficiency: Secondary | ICD-10-CM | POA: Diagnosis not present

## 2022-10-16 DIAGNOSIS — N393 Stress incontinence (female) (male): Secondary | ICD-10-CM | POA: Diagnosis not present

## 2022-10-16 DIAGNOSIS — Z8546 Personal history of malignant neoplasm of prostate: Secondary | ICD-10-CM | POA: Diagnosis not present

## 2022-11-11 DIAGNOSIS — L738 Other specified follicular disorders: Secondary | ICD-10-CM | POA: Diagnosis not present

## 2022-11-11 DIAGNOSIS — D2372 Other benign neoplasm of skin of left lower limb, including hip: Secondary | ICD-10-CM | POA: Diagnosis not present

## 2022-11-11 DIAGNOSIS — D2261 Melanocytic nevi of right upper limb, including shoulder: Secondary | ICD-10-CM | POA: Diagnosis not present

## 2022-11-11 DIAGNOSIS — L905 Scar conditions and fibrosis of skin: Secondary | ICD-10-CM | POA: Diagnosis not present

## 2022-11-11 DIAGNOSIS — L821 Other seborrheic keratosis: Secondary | ICD-10-CM | POA: Diagnosis not present

## 2022-11-11 DIAGNOSIS — L309 Dermatitis, unspecified: Secondary | ICD-10-CM | POA: Diagnosis not present

## 2022-11-11 DIAGNOSIS — Z85828 Personal history of other malignant neoplasm of skin: Secondary | ICD-10-CM | POA: Diagnosis not present

## 2022-11-11 DIAGNOSIS — D225 Melanocytic nevi of trunk: Secondary | ICD-10-CM | POA: Diagnosis not present

## 2022-11-11 DIAGNOSIS — D1801 Hemangioma of skin and subcutaneous tissue: Secondary | ICD-10-CM | POA: Diagnosis not present

## 2022-11-11 DIAGNOSIS — D692 Other nonthrombocytopenic purpura: Secondary | ICD-10-CM | POA: Diagnosis not present

## 2022-11-13 DIAGNOSIS — G3184 Mild cognitive impairment, so stated: Secondary | ICD-10-CM | POA: Diagnosis not present

## 2022-11-19 DIAGNOSIS — H25013 Cortical age-related cataract, bilateral: Secondary | ICD-10-CM | POA: Diagnosis not present

## 2022-12-15 DIAGNOSIS — F0280 Dementia in other diseases classified elsewhere without behavioral disturbance: Secondary | ICD-10-CM | POA: Diagnosis not present

## 2022-12-15 DIAGNOSIS — G300 Alzheimer's disease with early onset: Secondary | ICD-10-CM | POA: Diagnosis not present

## 2022-12-25 DIAGNOSIS — H0015 Chalazion left lower eyelid: Secondary | ICD-10-CM | POA: Diagnosis not present

## 2023-01-21 DIAGNOSIS — H00012 Hordeolum externum right lower eyelid: Secondary | ICD-10-CM | POA: Diagnosis not present

## 2023-02-01 DIAGNOSIS — M19071 Primary osteoarthritis, right ankle and foot: Secondary | ICD-10-CM | POA: Diagnosis not present

## 2023-02-17 DIAGNOSIS — M19071 Primary osteoarthritis, right ankle and foot: Secondary | ICD-10-CM | POA: Diagnosis not present

## 2023-02-19 ENCOUNTER — Encounter: Payer: Self-pay | Admitting: Family Medicine

## 2023-02-19 ENCOUNTER — Ambulatory Visit (INDEPENDENT_AMBULATORY_CARE_PROVIDER_SITE_OTHER): Payer: Medicare Other | Admitting: Family Medicine

## 2023-02-19 VITALS — BP 130/76 | HR 56 | Temp 97.8°F | Wt 169.6 lb

## 2023-02-19 DIAGNOSIS — R233 Spontaneous ecchymoses: Secondary | ICD-10-CM | POA: Diagnosis not present

## 2023-02-19 LAB — CBC WITH DIFFERENTIAL/PLATELET
Basophils Absolute: 0 10*3/uL (ref 0.0–0.1)
Basophils Relative: 0.3 % (ref 0.0–3.0)
Eosinophils Absolute: 0 10*3/uL (ref 0.0–0.7)
Eosinophils Relative: 0.2 % (ref 0.0–5.0)
HCT: 40.3 % (ref 39.0–52.0)
Hemoglobin: 13.4 g/dL (ref 13.0–17.0)
Lymphocytes Relative: 20.7 % (ref 12.0–46.0)
Lymphs Abs: 1.6 10*3/uL (ref 0.7–4.0)
MCHC: 33.2 g/dL (ref 30.0–36.0)
MCV: 93.4 fl (ref 78.0–100.0)
Monocytes Absolute: 0.6 10*3/uL (ref 0.1–1.0)
Monocytes Relative: 8.1 % (ref 3.0–12.0)
Neutro Abs: 5.6 10*3/uL (ref 1.4–7.7)
Neutrophils Relative %: 70.7 % (ref 43.0–77.0)
Platelets: 319 10*3/uL (ref 150.0–400.0)
RBC: 4.31 Mil/uL (ref 4.22–5.81)
RDW: 12.3 % (ref 11.5–15.5)
WBC: 7.9 10*3/uL (ref 4.0–10.5)

## 2023-02-19 LAB — HEPATIC FUNCTION PANEL
ALT: 15 U/L (ref 0–53)
AST: 21 U/L (ref 0–37)
Albumin: 4.4 g/dL (ref 3.5–5.2)
Alkaline Phosphatase: 50 U/L (ref 39–117)
Bilirubin, Direct: 0.2 mg/dL (ref 0.0–0.3)
Total Bilirubin: 0.9 mg/dL (ref 0.2–1.2)
Total Protein: 7.1 g/dL (ref 6.0–8.3)

## 2023-02-19 LAB — PROTIME-INR
INR: 1.3 ratio — ABNORMAL HIGH (ref 0.8–1.0)
Prothrombin Time: 13.8 s — ABNORMAL HIGH (ref 9.6–13.1)

## 2023-02-19 LAB — APTT: aPTT: 31.2 s (ref 25.4–36.8)

## 2023-02-19 NOTE — Progress Notes (Signed)
   Subjective:    Patient ID: [name], male    DOB: 1945-09-14, 77 y.o.   MRN: [id number]  HPI Here asking about easy bruising on his forearms. This began about 2 years ago, and it seems to be a little worse lately. No other bleeding issues, no nosebleeds or blood in the stool. He takes Ibuprofen occasionally but he never takes ASA. He has been on Cialis for about 2 year. He also takes several types of CBD products daily, two of which contain Lion's mane and Lychee.    Review of Systems  Constitutional: Negative.   Respiratory: Negative.    Cardiovascular: Negative.   Hematological:  Bruises/bleeds easily.       Objective:   Physical Exam Constitutional:      Appearance: Normal appearance.  Cardiovascular:     Rate and Rhythm: Normal rate and regular rhythm.     Pulses: Normal pulses.     Heart sounds: Normal heart sounds.  Pulmonary:     Effort: Pulmonary effort is normal.     Breath sounds: Normal breath sounds.  Skin:    Comments: Multiple patches of ecchymoses on the dorsal forearms. Otherwise his skin is clear.   Neurological:     Mental Status: He is alert.           Assessment & Plan:  Forearm bruising. I told him it is unlikely that any of the products he is taking by mouth are causing this. We will check labs today including CBC. Most likely this is a benign process caused by aging skin which gets thin.  Gershon Crane, MD

## 2023-03-11 DIAGNOSIS — M79671 Pain in right foot: Secondary | ICD-10-CM | POA: Diagnosis not present

## 2023-03-15 DIAGNOSIS — M19071 Primary osteoarthritis, right ankle and foot: Secondary | ICD-10-CM | POA: Diagnosis not present

## 2023-03-19 ENCOUNTER — Encounter: Payer: Self-pay | Admitting: Family Medicine

## 2023-03-19 ENCOUNTER — Ambulatory Visit: Payer: Medicare Other | Admitting: Family Medicine

## 2023-03-19 VITALS — BP 120/70 | HR 60 | Temp 98.4°F | Resp 16 | Ht 73.0 in | Wt 173.2 lb

## 2023-03-19 DIAGNOSIS — H1013 Acute atopic conjunctivitis, bilateral: Secondary | ICD-10-CM | POA: Diagnosis not present

## 2023-03-19 DIAGNOSIS — H578A3 Foreign body sensation, bilateral eyes: Secondary | ICD-10-CM

## 2023-03-19 DIAGNOSIS — K432 Incisional hernia without obstruction or gangrene: Secondary | ICD-10-CM | POA: Diagnosis not present

## 2023-03-19 MED ORDER — AZELASTINE HCL 0.05 % OP SOLN
1.0000 [drp] | Freq: Two times a day (BID) | OPHTHALMIC | 0 refills | Status: DC
Start: 2023-03-19 — End: 2023-07-19

## 2023-03-19 NOTE — Patient Instructions (Addendum)
A few things to remember from today's visit:  Allergic conjunctivitis of both eyes - Plan: azelastine (OPTIVAR) 0.05 % ophthalmic solution  Foreign body sensation, bilateral eyes  Please arrange appointment with your eye care provider if you do not describe improvement in 5 to 7 days or not resolved in 1-2 weeks.  Do not use My Chart to request refills or for acute issues that need immediate attention. If you send a my chart message, it may take a few days to be addressed, specially if I am not in the office.  Please be sure medication list is accurate. If a new problem present, please set up appointment sooner than planned today.

## 2023-03-19 NOTE — Progress Notes (Unsigned)
ACUTE VISIT Chief Complaint  Patient presents with   something in eye    Had styes on both eyes x 2 weeks ago - was on drops. Has crust around both eyes every morning, feels like something is in the eye    HPI: [name] is a 77 y.o. male with past medical history significant for***here today complaining of *** HPI  Review of Systems See other pertinent positives and negatives in HPI.  Current Outpatient Medications on File Prior to Visit  Medication Sig Dispense Refill   fexofenadine-pseudoephedrine (ALLEGRA-D 24) 180-240 MG 24 hr tablet Take 1 tablet by mouth daily.     Tadalafil 2.5 MG TABS      Triamcinolone Acetonide (TRIAMCINOLONE 0.1 % CREAM : EUCERIN) CREA One application BID topically to each ear canal 1 each 1   No current facility-administered medications on file prior to visit.   Past Medical History:  Diagnosis Date   Arthritis    hands and spine    Asthma    as a child    Elevated PSA    Erectile dysfunction    History of basal cell carcinoma excision    x2  scalp and head   History of epididymitis    Male stress incontinence    Pneumonia    hx of in 1968   Prostate cancer (HCC) 05/18/14   Gleason 4+3=7   Wears contact lenses    White coat hypertension    Allergies  Allergen Reactions   Lisinopril Other (See Comments)    Increased B/P  And tremors    Nitrofurantoin Other (See Comments)    Muscle aches.    Penicillins Other (See Comments)    Unknown childhood reaction   Doxycycline Rash   Levaquin [Levofloxacin] Rash    Social History   Socioeconomic History   Marital status: Married    Spouse name: Not on file   Number of children: Not on file   Years of education: Not on file   Highest education level: 12th grade  Occupational History   Not on file  Tobacco Use   Smoking status: Never   Smokeless tobacco: Never  Vaping Use   Vaping status: Never Used  Substance and Sexual Activity   Alcohol use: Not Currently   Drug use: No    Sexual activity: Not on file  Other Topics Concern   Not on file  Social History Narrative   Not on file   Social Determinants of Health   Financial Resource Strain: Low Risk  (09/28/2022)   Overall Financial Resource Strain (CARDIA)    Difficulty of Paying Living Expenses: Not hard at all  Food Insecurity: No Food Insecurity (09/28/2022)   Hunger Vital Sign    Worried About Running Out of Food in the Last Year: Never true    Ran Out of Food in the Last Year: Never true  Transportation Needs: No Transportation Needs (02/17/2023)   PRAPARE - Administrator, Civil Service (Medical): No    Lack of Transportation (Non-Medical): No  Physical Activity: Sufficiently Active (02/17/2023)   Exercise Vital Sign    Days of Exercise per Week: 5 days    Minutes of Exercise per Session: 150+ min  Stress: Stress Concern Present (02/17/2023)   [employer] of Occupational Health - Occupational Stress Questionnaire    Feeling of Stress : To some extent  Social Connections: Moderately Isolated (02/17/2023)   Social Connection and Isolation Panel [NHANES]    Frequency of  Communication with Friends and Family: Once a week    Frequency of Social Gatherings with Friends and Family: Never    Attends Religious Services: Never    Database administrator or Organizations: No    Attends Engineer, structural: More than 4 times per year    Marital Status: Married    Vitals:   03/19/23 1216  BP: 120/70  Pulse: 60  Resp: 16  Temp: 98.4 F (36.9 C)  SpO2: 97%   Body mass index is 22.86 kg/m.  Physical Exam Vitals and nursing note reviewed.  HENT:     Head: Normocephalic and atraumatic.     Mouth/Throat:     Mouth: Mucous membranes are moist.     Pharynx: Oropharynx is clear.  Eyes:     General: Lids are everted, no foreign bodies appreciated. Vision grossly intact.     Extraocular Movements: Extraocular movements intact.     Conjunctiva/sclera: Conjunctivae normal.      Pupils: Pupils are equal, round, and reactive to light.     Comments: 2-3 micropapular lesions appreciated on upper tarsal conjunctivae.   Cardiovascular:     Rate and Rhythm: Normal rate and regular rhythm.     Heart sounds: No murmur heard. Pulmonary:     Effort: Pulmonary effort is normal. No respiratory distress.     Breath sounds: Normal breath sounds.  Lymphadenopathy:     Head:     Right side of head: No preauricular adenopathy.     Left side of head: No preauricular adenopathy.     Cervical: No cervical adenopathy.  Neurological:     General: No focal deficit present.     Mental Status: He is alert and oriented to person, place, and time.     Cranial Nerves: No cranial nerve deficit.     Gait: Gait normal.  Psychiatric:        Mood and Affect: Mood and affect normal.   ASSESSMENT AND PLAN: Allergic conjunctivitis of both eyes -     Azelastine HCl; Place 1 drop into both eyes 2 (two) times daily.  Dispense: 6 mL; Refill: 0  Foreign body sensation, bilateral eyes   Vision Screening   Right eye Left eye Both eyes  Without correction 20/25 20/50 20/25   With correction       Return if symptoms worsen or fail to improve.  Michael Conard G. [place], MD  [facility]. [place] office.

## 2023-03-24 DIAGNOSIS — H10023 Other mucopurulent conjunctivitis, bilateral: Secondary | ICD-10-CM | POA: Diagnosis not present

## 2023-03-29 DIAGNOSIS — J069 Acute upper respiratory infection, unspecified: Secondary | ICD-10-CM | POA: Diagnosis not present

## 2023-04-28 ENCOUNTER — Encounter: Payer: Self-pay | Admitting: Family Medicine

## 2023-04-29 NOTE — Telephone Encounter (Signed)
I have no idea who suggest. It would be better for [name] to research this and then I would be happy to do a referral to them

## 2023-05-20 DIAGNOSIS — K432 Incisional hernia without obstruction or gangrene: Secondary | ICD-10-CM | POA: Diagnosis not present

## 2023-05-21 DIAGNOSIS — M19071 Primary osteoarthritis, right ankle and foot: Secondary | ICD-10-CM | POA: Diagnosis not present

## 2023-05-29 DIAGNOSIS — N390 Urinary tract infection, site not specified: Secondary | ICD-10-CM | POA: Diagnosis not present

## 2023-05-29 DIAGNOSIS — R32 Unspecified urinary incontinence: Secondary | ICD-10-CM | POA: Diagnosis not present

## 2023-05-29 DIAGNOSIS — A499 Bacterial infection, unspecified: Secondary | ICD-10-CM | POA: Diagnosis not present

## 2023-05-29 DIAGNOSIS — R300 Dysuria: Secondary | ICD-10-CM | POA: Diagnosis not present

## 2023-05-29 DIAGNOSIS — R319 Hematuria, unspecified: Secondary | ICD-10-CM | POA: Diagnosis not present

## 2023-05-31 ENCOUNTER — Other Ambulatory Visit (HOSPITAL_BASED_OUTPATIENT_CLINIC_OR_DEPARTMENT_OTHER): Payer: Self-pay

## 2023-05-31 DIAGNOSIS — Z23 Encounter for immunization: Secondary | ICD-10-CM | POA: Diagnosis not present

## 2023-05-31 MED ORDER — INFLUENZA VAC A&B SURF ANT ADJ 0.5 ML IM SUSY
0.5000 mL | PREFILLED_SYRINGE | Freq: Once | INTRAMUSCULAR | 0 refills | Status: AC
  Filled 2023-05-31: qty 0.5, 1d supply, fill #0

## 2023-05-31 MED ORDER — COVID-19 MRNA VAC-TRIS(PFIZER) 30 MCG/0.3ML IM SUSY
0.3000 mL | PREFILLED_SYRINGE | Freq: Once | INTRAMUSCULAR | 0 refills | Status: AC
  Filled 2023-05-31: qty 0.3, 1d supply, fill #0

## 2023-07-20 ENCOUNTER — Ambulatory Visit: Payer: Self-pay | Admitting: General Surgery

## 2023-07-20 NOTE — Pre-Procedure Instructions (Signed)
Surgical Instructions   Your procedure is scheduled on Wednesday, December 4th. Report to [facility] Main Entrance "A" at 06:30 A.M., then check in with the Admitting office. Any questions or running late day of surgery: call [phone]  Questions prior to your surgery date: call [phone], Monday-Friday, 8am-4pm. If you experience any cold or flu symptoms such as cough, fever, chills, shortness of breath, etc. between now and your scheduled surgery, please notify us at the above number.     Remember:  Do not eat after midnight the night before your surgery   You may drink clear liquids until 05:30 AM the morning of your surgery.   Clear liquids allowed are: Water, Non-Citrus Juices (without pulp), Carbonated Beverages, Clear Tea (no milk, honey, etc.), Black Coffee Only (NO MILK, CREAM OR POWDERED CREAMER of any kind), and Gatorade.  Patient Instructions  The night before surgery:  No food after midnight. ONLY clear liquids after midnight  The day of surgery (if you do NOT have diabetes):  Drink ONE (1) Pre-Surgery Clear Ensure by 5:30 the morning of surgery. Drink in one sitting. Do not sip.  This drink was given to you during your hospital  pre-op appointment visit.  Nothing else to drink after completing the  Pre-Surgery Clear Ensure.         If you have questions, please contact your surgeon's office.     Take these medicines the morning of surgery with A SIP OF WATER  fexofenadine (ALLEGRA)  Investigational - Study Medication, CT 1812, for Alzheimer's   May take these medicines IF NEEDED: carboxymethylcellulose (REFRESH PLUS)    One week prior to surgery, STOP taking any Aspirin (unless otherwise instructed by your surgeon) Aleve, Naproxen, Ibuprofen, Motrin, Advil, Goody's, BC's, all herbal medications, fish oil, and non-prescription vitamins.                     Do NOT Smoke (Tobacco/Vaping) for 24 hours prior to your procedure.  If you use a CPAP at  night, you may bring your mask/headgear for your overnight stay.   You will be asked to remove any contacts, glasses, piercing's, hearing aid's, dentures/partials prior to surgery. Please bring cases for these items if needed.    Patients discharged the day of surgery will not be allowed to drive home, and someone needs to stay with them for 24 hours.  SURGICAL WAITING ROOM VISITATION Patients may have no more than 2 support people in the waiting area - these visitors may rotate.   Pre-op nurse will coordinate an appropriate time for 1 ADULT support person, who may not rotate, to accompany patient in pre-op.  Children under the age of 78 must have an adult with them who is not the patient and must remain in the main waiting area with an adult.  If the patient needs to stay at the hospital during part of their recovery, the visitor guidelines for inpatient rooms apply.  Please refer to the [facility] website for the visitor guidelines for any additional information.   If you received a COVID test during your pre-op visit  it is requested that you wear a mask when out in public, stay away from anyone that may not be feeling well and notify your surgeon if you develop symptoms. If you have been in contact with anyone that has tested positive in the last 10 days please notify you surgeon.      Pre-operative CHG Bathing Instructions   You can play  a key role in reducing the risk of infection after surgery. Your skin needs to be as free of germs as possible. You can reduce the number of germs on your skin by washing with CHG (chlorhexidine gluconate) soap before surgery. CHG is an antiseptic soap that kills germs and continues to kill germs even after washing.   DO NOT use if you have an allergy to chlorhexidine/CHG or antibacterial soaps. If your skin becomes reddened or irritated, stop using the CHG and notify one of our RNs at [phone].              TAKE A SHOWER THE NIGHT BEFORE SURGERY  AND THE DAY OF SURGERY    Please keep in mind the following:  DO NOT shave, including legs and underarms, 48 hours prior to surgery.   You may shave your face before/day of surgery.  Place clean sheets on your bed the night before surgery Use a clean washcloth (not used since being washed) for each shower. DO NOT sleep with pet's night before surgery.  CHG Shower Instructions:  Wash your face and private area with normal soap. If you choose to wash your hair, wash first with your normal shampoo.  After you use shampoo/soap, rinse your hair and body thoroughly to remove shampoo/soap residue.  Turn the water OFF and apply half the bottle of CHG soap to a CLEAN washcloth.  Apply CHG soap ONLY FROM YOUR NECK DOWN TO YOUR TOES (washing for 3-5 minutes)  DO NOT use CHG soap on face, private areas, open wounds, or sores.  Pay special attention to the area where your surgery is being performed.  If you are having back surgery, having someone wash your back for you may be helpful. Wait 2 minutes after CHG soap is applied, then you may rinse off the CHG soap.  Pat dry with a clean towel  Put on clean pajamas    Additional instructions for the day of surgery: DO NOT APPLY any lotions, deodorants, cologne, or perfumes.   Do not wear jewelry or makeup Do not wear nail polish, gel polish, artificial nails, or any other type of covering on natural nails (fingers and toes) Do not bring valuables to the hospital. [facility] is not responsible for valuables/personal belongings. Put on clean/comfortable clothes.  Please brush your teeth.  Ask your nurse before applying any prescription medications to the skin.

## 2023-07-20 NOTE — Pre-Procedure Instructions (Signed)
Surgical Instructions   Your procedure is scheduled on Wednesday, December 4th. Report to [facility] Main Entrance "A" at 06:30 A.M., then check in with the Admitting office. Any questions or running late day of surgery: call [phone]  Questions prior to your surgery date: call [phone], Monday-Friday, 8am-4pm. If you experience any cold or flu symptoms such as cough, fever, chills, shortness of breath, etc. between now and your scheduled surgery, please notify us at the above number.     Remember:  Do not eat after midnight the night before your surgery   You may drink clear liquids until 05:30 AM the morning of your surgery.   Clear liquids allowed are: Water, Non-Citrus Juices (without pulp), Carbonated Beverages, Clear Tea (no milk, honey, etc.), Black Coffee Only (NO MILK, CREAM OR POWDERED CREAMER of any kind), and Gatorade.    Take these medicines the morning of surgery with A SIP OF WATER  fexofenadine (ALLEGRA)    May take these medicines IF NEEDED: carboxymethylcellulose (REFRESH PLUS)    One week prior to surgery, STOP taking any Aspirin (unless otherwise instructed by your surgeon) Aleve, Naproxen, Ibuprofen, Motrin, Advil, Goody's, BC's, all herbal medications, fish oil, and non-prescription vitamins.                     Do NOT Smoke (Tobacco/Vaping) for 24 hours prior to your procedure.  If you use a CPAP at night, you may bring your mask/headgear for your overnight stay.   You will be asked to remove any contacts, glasses, piercing's, hearing aid's, dentures/partials prior to surgery. Please bring cases for these items if needed.    Patients discharged the day of surgery will not be allowed to drive home, and someone needs to stay with them for 24 hours.  SURGICAL WAITING ROOM VISITATION Patients may have no more than 2 support people in the waiting area - these visitors may rotate.   Pre-op nurse will coordinate an appropriate time for 1 ADULT support  person, who may not rotate, to accompany patient in pre-op.  Children under the age of 93 must have an adult with them who is not the patient and must remain in the main waiting area with an adult.  If the patient needs to stay at the hospital during part of their recovery, the visitor guidelines for inpatient rooms apply.  Please refer to the [facility] website for the visitor guidelines for any additional information.   If you received a COVID test during your pre-op visit  it is requested that you wear a mask when out in public, stay away from anyone that may not be feeling well and notify your surgeon if you develop symptoms. If you have been in contact with anyone that has tested positive in the last 10 days please notify you surgeon.      Pre-operative CHG Bathing Instructions   You can play a key role in reducing the risk of infection after surgery. Your skin needs to be as free of germs as possible. You can reduce the number of germs on your skin by washing with CHG (chlorhexidine gluconate) soap before surgery. CHG is an antiseptic soap that kills germs and continues to kill germs even after washing.   DO NOT use if you have an allergy to chlorhexidine/CHG or antibacterial soaps. If your skin becomes reddened or irritated, stop using the CHG and notify one of our RNs at [phone].  TAKE A SHOWER THE NIGHT BEFORE SURGERY AND THE DAY OF SURGERY    Please keep in mind the following:  DO NOT shave, including legs and underarms, 48 hours prior to surgery.   You may shave your face before/day of surgery.  Place clean sheets on your bed the night before surgery Use a clean washcloth (not used since being washed) for each shower. DO NOT sleep with pet's night before surgery.  CHG Shower Instructions:  Wash your face and private area with normal soap. If you choose to wash your hair, wash first with your normal shampoo.  After you use shampoo/soap, rinse your hair and  body thoroughly to remove shampoo/soap residue.  Turn the water OFF and apply half the bottle of CHG soap to a CLEAN washcloth.  Apply CHG soap ONLY FROM YOUR NECK DOWN TO YOUR TOES (washing for 3-5 minutes)  DO NOT use CHG soap on face, private areas, open wounds, or sores.  Pay special attention to the area where your surgery is being performed.  If you are having back surgery, having someone wash your back for you may be helpful. Wait 2 minutes after CHG soap is applied, then you may rinse off the CHG soap.  Pat dry with a clean towel  Put on clean pajamas    Additional instructions for the day of surgery: DO NOT APPLY any lotions, deodorants, cologne, or perfumes.   Do not wear jewelry or makeup Do not wear nail polish, gel polish, artificial nails, or any other type of covering on natural nails (fingers and toes) Do not bring valuables to the hospital. [facility] is not responsible for valuables/personal belongings. Put on clean/comfortable clothes.  Please brush your teeth.  Ask your nurse before applying any prescription medications to the skin.

## 2023-07-21 ENCOUNTER — Encounter (HOSPITAL_COMMUNITY)
Admission: RE | Admit: 2023-07-21 | Discharge: 2023-07-21 | Disposition: A | Discharge status: Home / Self Care | Payer: Medicare Other | Source: Ambulatory Visit | Attending: General Surgery | Admitting: General Surgery

## 2023-07-21 ENCOUNTER — Other Ambulatory Visit: Payer: Self-pay

## 2023-07-21 ENCOUNTER — Encounter (HOSPITAL_COMMUNITY): Payer: Self-pay

## 2023-07-21 VITALS — BP 127/70 | HR 66 | Temp 98.2°F | Resp 18 | Ht 73.0 in | Wt 173.3 lb

## 2023-07-21 DIAGNOSIS — Z01818 Encounter for other preprocedural examination: Secondary | ICD-10-CM | POA: Diagnosis not present

## 2023-07-21 HISTORY — DX: Unspecified dementia, unspecified severity, without behavioral disturbance, psychotic disturbance, mood disturbance, and anxiety: F03.90

## 2023-07-21 LAB — BASIC METABOLIC PANEL
Anion gap: 10 (ref 5–15)
BUN: 15 mg/dL (ref 8–23)
CO2: 25 mmol/L (ref 22–32)
Calcium: 9.1 mg/dL (ref 8.9–10.3)
Chloride: 102 mmol/L (ref 98–111)
Creatinine, Ser: 0.99 mg/dL (ref 0.61–1.24)
GFR, Estimated: >60 mL/min (ref >60)
Glucose, Bld: 93 mg/dL (ref 70–99)
Potassium: 4.4 mmol/L (ref 3.5–5.1)
Sodium: 137 mmol/L (ref 135–145)

## 2023-07-21 LAB — CBC
HCT: 41.5 % (ref 39.0–52.0)
Hemoglobin: 13.7 g/dL (ref 13.0–17.0)
MCH: 31.5 pg (ref 26.0–34.0)
MCHC: 33 g/dL (ref 30.0–36.0)
MCV: 95.4 fL (ref 80.0–100.0)
Platelets: 344 10*3/uL (ref 150–400)
RBC: 4.35 MIL/uL (ref 4.22–5.81)
RDW: 11.8 % (ref 11.5–15.5)
WBC: 8.2 10*3/uL (ref 4.0–10.5)
nRBC: 0 % (ref 0.0–0.2)

## 2023-07-21 NOTE — Progress Notes (Signed)
PCP - Dr. Gershon Crane Cardiologist -   PPM/ICD - denies Device Orders - na Rep Notified - na  Chest x-ray - na EKG - PAT, 07/21/2023 Stress Test -  ECHO - 10/08/2017 Cardiac Cath -   Sleep Study - denies CPAP - na  Non-diabetic  Blood Thinner Instructions:denies Aspirin Instructions:denies  ERAS Protcol -Ensure until 0530  COVID TEST- No  Anesthesia review: No  Patient denies shortness of breath, fever, cough and chest pain at PAT appointment   All instructions explained to the patient, with a verbal understanding of the material. Patient agrees to go over the instructions while at home for a better understanding. Patient also instructed to self quarantine after being tested for COVID-19. The opportunity to ask questions was provided.

## 2023-07-27 NOTE — H&P (Signed)
Chief Complaint: RE-CHECK       History of Present Illness: [name] is a 77 y.o. male who is seen today as an office consultation at the request of Dr. Seymour Bars for evaluation of RE-CHECK .   Patient is a 77 year old male who comes in secondary to follow-up for a incisional hernia after prostatectomy. Patient states he would like to discuss further robotic versus laparoscopic surgery. Patient states that he had no significant changes of his hernia.   Patient remains active and works out with a Psychologist, educational.       Review of Systems: A complete review of systems was obtained from the patient.  I have reviewed this information and discussed as appropriate with the patient.  See HPI as well for other ROS.   Review of Systems  Constitutional:  Negative for fever.  HENT:  Negative for congestion.   Eyes:  Negative for blurred vision.  Respiratory:  Negative for cough, shortness of breath and wheezing.   Cardiovascular:  Negative for chest pain and palpitations.  Gastrointestinal:  Negative for heartburn.  Genitourinary:  Negative for dysuria.  Musculoskeletal:  Negative for myalgias.  Skin:  Negative for rash.  Neurological:  Negative for dizziness and headaches.  Psychiatric/Behavioral:  Negative for depression and suicidal ideas.   All other systems reviewed and are negative.       Medical History: Past Medical History History reviewed. No pertinent past medical history.     Problem List There is no problem list on file for this patient.     Past Surgical History Past Surgical History: Procedure Laterality Date  ABDOMINAL LYMPHADENECTOMY      ROBOT ASSISTED LAPAROSCOPIC PROSTATECTOMY, RETROPUBIC RADICAL, INCLUDING NERVE SPARING          Allergies Allergies Allergen Reactions  Penicillins Itching and Other (See Comments)     Unknown childhood reaction  Lisinopril Other (See Comments)     Increased B/P  And tremors    Increased B/P  And  tremors  Nitrofurantoin Other (See Comments)     Muscle aches.    Muscle aches.  Doxycycline Rash  Levofloxacin Rash      Medications Ordered Prior to Encounter Current Outpatient Medications on File Prior to Visit Medication Sig Dispense Refill  fexofenadine (ALLEGRA ALLERGY) 180 MG tablet        glucosamine sulfate 500 mg Tab Take by mouth      magnesium 250 mg Tab Take 250 mg by mouth 2 (two) times daily      magnesium oxide 250 mg magnesium Tab        tadalafiL (CIALIS) 2.5 mg tablet          No current facility-administered medications on file prior to visit.      Family History Family History Problem Relation Age of Onset  Coronary Artery Disease (Blocked arteries around heart) Father    Breast cancer Sister        Tobacco Use History Social History    Tobacco Use Smoking Status Never Smokeless Tobacco Never      Social History Social History    Socioeconomic History  Marital status: Married Tobacco Use  Smoking status: Never  Smokeless tobacco: Never Substance and Sexual Activity  Alcohol use: Yes  Drug use: Never    Social Determinants of Health    Financial Resource Strain: Low Risk  (09/28/2022)   Received from [facility]   Overall Financial Resource Strain (CARDIA)    Difficulty of Paying Living Expenses: Not hard at  all Food Insecurity: No Food Insecurity (09/28/2022)   Received from [facility]   Hunger Vital Sign    Worried About Running Out of Food in the Last Year: Never true    Ran Out of Food in the Last Year: Never true Transportation Needs: No Transportation Needs (02/17/2023)   Received from [facility] - Transportation    Lack of Transportation (Medical): No    Lack of Transportation (Non-Medical): No Physical Activity: Sufficiently Active (02/17/2023)   Received from [facility]   Exercise Vital Sign    Days of Exercise per Week: 5 days    Minutes of Exercise per Session: 150+ min Stress: Stress Concern Present  (02/17/2023)   Received from [facility] of Occupational Health - Occupational Stress Questionnaire    Feeling of Stress : To some extent Social Connections: Moderately Isolated (02/17/2023)   Received from [facility]   Social Connection and Isolation Panel [NHANES]    Frequency of Communication with Friends and Family: Once a week    Frequency of Social Gatherings with Friends and Family: Never    Attends Religious Services: Never    Database administrator or Organizations: No    Attends Engineer, structural: More than 4 times per year    Marital Status: Married      Objective:     Vitals:   05/20/23 1031 PainSc: 0-No pain PainLoc: Abdomen   There is no height or weight on file to calculate BMI.   Physical Exam Constitutional:      General: He is not in acute distress.    Appearance: Normal appearance.  HENT:     Head: Normocephalic.     Nose: No rhinorrhea.     Mouth/Throat:     Mouth: Mucous membranes are moist.     Pharynx: Oropharynx is clear.  Eyes:     General: No scleral icterus.    Pupils: Pupils are equal, round, and reactive to light.  Cardiovascular:     Rate and Rhythm: Normal rate.     Pulses: Normal pulses.  Pulmonary:     Effort: Pulmonary effort is normal. No respiratory distress.     Breath sounds: No stridor. No wheezing.  Abdominal:     General: Abdomen is flat. There is no distension.     Tenderness: There is no abdominal tenderness. There is no guarding or rebound.     Hernia: A hernia is present. Hernia is present in the ventral area.     Musculoskeletal:        General: Normal range of motion.     Cervical back: Normal range of motion and neck supple.  Skin:    General: Skin is warm and dry.     Capillary Refill: Capillary refill takes less than 2 seconds.     Coloration: Skin is not jaundiced.  Neurological:     General: No focal deficit present.     Mental Status: He is alert and oriented to person,  place, and time. Mental status is at baseline.  Psychiatric:        Mood and Affect: Mood normal.        Thought Content: Thought content normal.        Judgment: Judgment normal.        Hernia Size: 4 cm Incarcerated: No Initial Hernia     Assessment and Plan: Diagnoses and all orders for this visit:   Incisional hernia without obstruction or  gangrene     [name] is a 77 y.o. male    77 year old male with a incisional hernia after robotic prostatectomy.  Patient had appears to have a hernia x 2 just superior to the umbilicus the midline and at the umbilicus.  We had a long discussion about the pros and cons of robotic versus laparoscopic surgery.  Patient would be okay with proceeding with laparoscopic surgery. 1.  We will proceed to the OR for a lap ventral hernia repair with mesh. 2. All risks and benefits were discussed with the patient, to generally include infection, bleeding, damage to surrounding structures, acute and chronic nerve pain, and recurrence. Alternatives were offered and described.  All questions were answered and the patient voiced understanding of the procedure and wishes to proceed at this point.

## 2023-07-28 ENCOUNTER — Other Ambulatory Visit: Payer: Self-pay

## 2023-07-28 ENCOUNTER — Ambulatory Visit (HOSPITAL_BASED_OUTPATIENT_CLINIC_OR_DEPARTMENT_OTHER): Payer: Medicare Other | Admitting: Certified Registered Nurse Anesthetist

## 2023-07-28 ENCOUNTER — Encounter (HOSPITAL_COMMUNITY): Payer: Self-pay | Admitting: General Surgery

## 2023-07-28 ENCOUNTER — Ambulatory Visit (HOSPITAL_COMMUNITY): Payer: Medicare Other | Admitting: Certified Registered Nurse Anesthetist

## 2023-07-28 ENCOUNTER — Encounter (HOSPITAL_COMMUNITY)
Admission: RE | Disposition: A | Discharge status: Home / Self Care | Payer: Self-pay | Source: Home / Self Care | Attending: General Surgery

## 2023-07-28 ENCOUNTER — Ambulatory Visit (HOSPITAL_COMMUNITY)
Admission: RE | Admit: 2023-07-28 | Discharge: 2023-07-28 | Disposition: A | Discharge status: Home / Self Care | Payer: Medicare Other | Attending: General Surgery | Admitting: General Surgery

## 2023-07-28 DIAGNOSIS — Z9889 Other specified postprocedural states: Secondary | ICD-10-CM | POA: Insufficient documentation

## 2023-07-28 DIAGNOSIS — I10 Essential (primary) hypertension: Secondary | ICD-10-CM | POA: Insufficient documentation

## 2023-07-28 DIAGNOSIS — Z79899 Other long term (current) drug therapy: Secondary | ICD-10-CM | POA: Diagnosis not present

## 2023-07-28 DIAGNOSIS — K432 Incisional hernia without obstruction or gangrene: Secondary | ICD-10-CM | POA: Diagnosis not present

## 2023-07-28 DIAGNOSIS — E785 Hyperlipidemia, unspecified: Secondary | ICD-10-CM | POA: Diagnosis not present

## 2023-07-28 DIAGNOSIS — J45909 Unspecified asthma, uncomplicated: Secondary | ICD-10-CM | POA: Diagnosis not present

## 2023-07-28 HISTORY — PX: INSERTION OF MESH: SHX5868

## 2023-07-28 HISTORY — PX: INCISIONAL HERNIA REPAIR: SHX193

## 2023-07-28 SURGERY — REPAIR, HERNIA, INCISIONAL, LAPAROSCOPIC
Anesthesia: General | Site: Abdomen

## 2023-07-28 MED ORDER — ROCURONIUM BROMIDE 10 MG/ML (PF) SYRINGE
PREFILLED_SYRINGE | INTRAVENOUS | Status: DC | PRN
  Administered 2023-07-28: 50 mg via INTRAVENOUS
  Administered 2023-07-28: 20 mg via INTRAVENOUS

## 2023-07-28 MED ORDER — AMISULPRIDE (ANTIEMETIC) 5 MG/2ML IV SOLN: 10.0000 mg | Freq: Once | INTRAVENOUS | Status: DC | PRN

## 2023-07-28 MED ORDER — VANCOMYCIN HCL IN DEXTROSE 1-5 GM/200ML-% IV SOLN
1000.0000 mg | INTRAVENOUS | Status: AC
  Administered 2023-07-28: 1000 mg via INTRAVENOUS
  Filled 2023-07-28: qty 200

## 2023-07-28 MED ORDER — CHLORHEXIDINE GLUCONATE 0.12 % MT SOLN
15.0000 mL | Freq: Once | OROMUCOSAL | Status: AC
  Administered 2023-07-28: 15 mL via OROMUCOSAL
  Filled 2023-07-28: qty 15

## 2023-07-28 MED ORDER — ORAL CARE MOUTH RINSE: 15.0000 mL | Freq: Once | OROMUCOSAL | Status: AC

## 2023-07-28 MED ORDER — OXYCODONE HCL 5 MG PO TABS
5.0000 mg | ORAL_TABLET | Freq: Once | ORAL | Status: AC | PRN
  Administered 2023-07-28: 5 mg via ORAL

## 2023-07-28 MED ORDER — OXYCODONE HCL 5 MG PO TABS
ORAL_TABLET | ORAL | Status: AC
  Filled 2023-07-28: qty 1

## 2023-07-28 MED ORDER — SUGAMMADEX SODIUM 200 MG/2ML IV SOLN
INTRAVENOUS | Status: DC | PRN
  Administered 2023-07-28: 200 mg via INTRAVENOUS

## 2023-07-28 MED ORDER — PROPOFOL 10 MG/ML IV BOLUS
INTRAVENOUS | Status: AC
  Filled 2023-07-28: qty 20

## 2023-07-28 MED ORDER — HYDROMORPHONE HCL 1 MG/ML IJ SOLN
0.2500 mg | INTRAMUSCULAR | Status: DC | PRN
  Administered 2023-07-28: 0.5 mg via INTRAVENOUS
  Administered 2023-07-28 (×2): 0.25 mg via INTRAVENOUS

## 2023-07-28 MED ORDER — CHLORHEXIDINE GLUCONATE CLOTH 2 % EX PADS: 6.0000 | MEDICATED_PAD | Freq: Once | CUTANEOUS | Status: DC

## 2023-07-28 MED ORDER — TRAMADOL HCL 50 MG PO TABS: 50.0000 mg | ORAL_TABLET | Freq: Four times a day (QID) | ORAL | 0 refills | Status: DC | PRN

## 2023-07-28 MED ORDER — SODIUM CHLORIDE 0.9 % IV SOLN: 12.5000 mg | INTRAVENOUS | Status: DC | PRN

## 2023-07-28 MED ORDER — BUPIVACAINE LIPOSOME 1.3 % IJ SUSP
INTRAMUSCULAR | Status: DC | PRN
  Administered 2023-07-28: 20 mL

## 2023-07-28 MED ORDER — FENTANYL CITRATE (PF) 250 MCG/5ML IJ SOLN
INTRAMUSCULAR | Status: DC | PRN
  Administered 2023-07-28 (×3): 50 ug via INTRAVENOUS

## 2023-07-28 MED ORDER — 0.9 % SODIUM CHLORIDE (POUR BTL) OPTIME
TOPICAL | Status: DC | PRN
  Administered 2023-07-28: 1000 mL

## 2023-07-28 MED ORDER — BUPIVACAINE LIPOSOME 1.3 % IJ SUSP
INTRAMUSCULAR | Status: AC
  Filled 2023-07-28: qty 20

## 2023-07-28 MED ORDER — PROPOFOL 10 MG/ML IV BOLUS
INTRAVENOUS | Status: DC | PRN
  Administered 2023-07-28: 150 mg via INTRAVENOUS

## 2023-07-28 MED ORDER — ONDANSETRON HCL 4 MG/2ML IJ SOLN
INTRAMUSCULAR | Status: DC | PRN
  Administered 2023-07-28: 4 mg via INTRAVENOUS

## 2023-07-28 MED ORDER — HYDROMORPHONE HCL 1 MG/ML IJ SOLN
INTRAMUSCULAR | Status: AC
  Filled 2023-07-28: qty 1

## 2023-07-28 MED ORDER — ACETAMINOPHEN 500 MG PO TABS
1000.0000 mg | ORAL_TABLET | ORAL | Status: AC
  Administered 2023-07-28: 1000 mg via ORAL
  Filled 2023-07-28: qty 2

## 2023-07-28 MED ORDER — FENTANYL CITRATE (PF) 250 MCG/5ML IJ SOLN
INTRAMUSCULAR | Status: AC
  Filled 2023-07-28: qty 5

## 2023-07-28 MED ORDER — DEXAMETHASONE SODIUM PHOSPHATE 10 MG/ML IJ SOLN
INTRAMUSCULAR | Status: DC | PRN
  Administered 2023-07-28: 10 mg via INTRAVENOUS

## 2023-07-28 MED ORDER — LIDOCAINE 2% (20 MG/ML) 5 ML SYRINGE
INTRAMUSCULAR | Status: DC | PRN
  Administered 2023-07-28: 60 mg via INTRAVENOUS

## 2023-07-28 MED ORDER — OXYCODONE HCL 5 MG/5ML PO SOLN: 5.0000 mg | Freq: Once | ORAL | Status: AC | PRN

## 2023-07-28 MED ORDER — BUPIVACAINE HCL 0.25 % IJ SOLN
INTRAMUSCULAR | Status: DC | PRN
  Administered 2023-07-28: 7 mL

## 2023-07-28 MED ORDER — EPHEDRINE SULFATE-NACL 50-0.9 MG/10ML-% IV SOSY
PREFILLED_SYRINGE | INTRAVENOUS | Status: DC | PRN
  Administered 2023-07-28: 5 mg via INTRAVENOUS

## 2023-07-28 MED ORDER — BUPIVACAINE HCL (PF) 0.25 % IJ SOLN
INTRAMUSCULAR | Status: AC
  Filled 2023-07-28: qty 30

## 2023-07-28 MED ORDER — LACTATED RINGERS IV SOLN
INTRAVENOUS | Status: DC | PRN
Start: 2023-07-28 — End: 2023-07-28

## 2023-07-28 SURGICAL SUPPLY — 41 items
APPLIER CLIP LOGIC TI 5 (MISCELLANEOUS) IMPLANT
BLADE CLIPPER SURG (BLADE) IMPLANT
CANISTER SUCT 3000ML PPV (MISCELLANEOUS) IMPLANT
CHLORAPREP W/TINT 26 (MISCELLANEOUS) ×1 IMPLANT
COVER SURGICAL LIGHT HANDLE (MISCELLANEOUS) ×1 IMPLANT
DERMABOND ADVANCED .7 DNX12 (GAUZE/BANDAGES/DRESSINGS) ×1 IMPLANT
DEVICE SECURE STRAP 25 ABSORB (INSTRUMENTS) ×1 IMPLANT
ELECT REM PT RETURN 9FT ADLT (ELECTROSURGICAL) ×1
ELECTRODE REM PT RTRN 9FT ADLT (ELECTROSURGICAL) ×1 IMPLANT
GLOVE BIO SURGEON STRL SZ7.5 (GLOVE) ×2 IMPLANT
GOWN STRL REUS W/ TWL LRG LVL3 (GOWN DISPOSABLE) ×2 IMPLANT
GOWN STRL REUS W/ TWL XL LVL3 (GOWN DISPOSABLE) ×1 IMPLANT
GRASPER SUT TROCAR 14GX15 (MISCELLANEOUS) ×1 IMPLANT
KIT BASIN OR (CUSTOM PROCEDURE TRAY) ×1 IMPLANT
KIT TURNOVER KIT B (KITS) ×1 IMPLANT
MARKER SKIN DUAL TIP RULER LAB (MISCELLANEOUS) ×1 IMPLANT
MESH VENTRLGHT ELLIPSE 8X6XMFL (Mesh Specialty) IMPLANT
NDL INSUFFLATION 14GA 120MM (NEEDLE) ×1 IMPLANT
NDL SPNL 22GX3.5 QUINCKE BK (NEEDLE) IMPLANT
NEEDLE INSUFFLATION 14GA 120MM (NEEDLE) ×1
NEEDLE SPNL 22GX3.5 QUINCKE BK (NEEDLE) ×1
NS IRRIG 1000ML POUR BTL (IV SOLUTION) ×1 IMPLANT
PAD ARMBOARD 7.5X6 YLW CONV (MISCELLANEOUS) ×2 IMPLANT
POUCH LAPAROSCOPIC INSTRUMENT (MISCELLANEOUS) IMPLANT
SCISSORS LAP 5X35 DISP (ENDOMECHANICALS) ×1 IMPLANT
SET TUBE SMOKE EVAC HIGH FLOW (TUBING) ×1 IMPLANT
SLEEVE Z-THREAD 5X100MM (TROCAR) ×1 IMPLANT
SUT CHROMIC 2 0 SH (SUTURE) ×1 IMPLANT
SUT ETHIBOND 0 MO6 C/R (SUTURE) ×1 IMPLANT
SUT ETHIBOND CT1 BRD #0 30IN (SUTURE) IMPLANT
SUT MNCRL AB 4-0 PS2 18 (SUTURE) ×1 IMPLANT
SUT NOVA NAB DX-16 0-1 5-0 T12 (SUTURE) IMPLANT
SUT PROLENE 2 0 KS (SUTURE) IMPLANT
SUT SILK 3 0 SH 30 (SUTURE) IMPLANT
TOWEL GREEN STERILE FF (TOWEL DISPOSABLE) ×1 IMPLANT
TRAY FOLEY W/BAG SLVR 16FR ST (SET/KITS/TRAYS/PACK) IMPLANT
TRAY LAPAROSCOPIC MC (CUSTOM PROCEDURE TRAY) ×1 IMPLANT
TROCAR 11X100 Z THREAD (TROCAR) IMPLANT
TROCAR Z-THREAD OPTICAL 5X100M (TROCAR) ×1 IMPLANT
WARMER LAPAROSCOPE (MISCELLANEOUS) ×1 IMPLANT
WATER STERILE IRR 1000ML POUR (IV SOLUTION) ×1 IMPLANT

## 2023-07-28 NOTE — Interval H&P Note (Signed)
History and Physical Interval Note:  07/28/2023 8:23 AM  [name]  has presented today for surgery, with the diagnosis of INCISIONAL HERNIA [id number].  The various methods of treatment have been discussed with the patient and family. After consideration of risks, benefits and other options for treatment, the patient has consented to  Procedure(s): LAPAROSCOPIC INCISIONAL HERNIA X2 WITH MESH (N/A) as a surgical intervention.  The patient's history has been reviewed, patient examined, no change in status, stable for surgery.  I have reviewed the patient's chart and labs.  Questions were answered to the patient's satisfaction.     Axel Filler

## 2023-07-28 NOTE — Transfer of Care (Signed)
Immediate Anesthesia Transfer of Care Note  Patient: [name]  Procedure(s) Performed: LAPAROSCOPIC INCISIONAL HERNIA X2 WITH MESH  Patient Location: PACU  Anesthesia Type:General  Level of Consciousness: drowsy and patient cooperative  Airway & Oxygen Therapy: Patient Spontanous Breathing  Post-op Assessment: Report given to RN, Post -op Vital signs reviewed and stable, and Patient moving all extremities X 4  Post vital signs: Reviewed and stable  Last Vitals:  Vitals Value Taken Time  BP 150/75 07/28/23 0926  Temp    Pulse 61 07/28/23 0928  Resp 9 07/28/23 0928  SpO2 98 % 07/28/23 0928  Vitals shown include unfiled device data.  Last Pain:  Vitals:   07/28/23 0657  TempSrc: Oral  PainSc: 0-No pain         Complications: No notable events documented.

## 2023-07-28 NOTE — Op Note (Signed)
07/28/2023  9:15 AM  PATIENT:  [name]  77 y.o. male  PRE-OPERATIVE DIAGNOSIS:  INCISIONAL HERNIA X2, 8cm  POST-OPERATIVE DIAGNOSIS:  INCISIONAL HERNIA X2, 8cm  PROCEDURE:  Procedure(s): LAPAROSCOPIC INCISIONAL HERNIA X2 WITH MESH (N/A)  SURGEON:  Surgeons and Role:    Axel Filler, MD - Primary  ASSISTANTS: Rockwell Germany, RNFA   ANESTHESIA:   local and general  EBL:  minimal   BLOOD ADMINISTERED:none  DRAINS: none   LOCAL MEDICATIONS USED:  BUPIVICAINE   SPECIMEN:  No Specimen  DISPOSITION OF SPECIMEN:  N/A  COUNTS:  YES  TOURNIQUET:  * No tourniquets in log *  DICTATION: .Dragon Dictation  Findings: 2 hernias, 8cm apart total  Details of the procedure:  After the patient was consented patient was taken back to the operating room patient was then placed in supine position bilateral SCDs in place.  The patient was prepped and draped in the usual sterile fashion. After antibiotics were confirmed a timeout was called and all facts were verified. The Veress needle technique was used to insuflate the abdomen at Palmer's point. The abdomen was insufflated to 14 mm mercury. Subsequently a 5 mm trocar was placed a camera inserted there was no injury to any intra-abdominal organs.    There was seen to be a non-incarcerated umbilical and incisional hernia, measured together they were 8cm hernia in total.  A second camera port was in placed into the left lower quadrant.   At this the Falicform ligament was taken down with Bovie cautery maintaining hemostasis.  I proceeded to reduce the hernia contents.  The hernia sac was dissected out of the hernia and disposed.  The fascia at the hernia was reapproximated using a #0 Ethibonds x 3 and x2 individually.  Once the hernia was cleared away, a Bard Ventralight 15x20cm  mesh was inserted into the abdomen.  The mesh was secured circumferentially with am Securestrap tacker in a double crown fashion.   The omentum was brought  over the area of the mesh. The fascia at the left lower quadrant port site was reapproximated with a 0 vicryl and an endoclose device.  The pneumoperitoneum was evacuated & all trocars  were removed. The skin was reapproximated with 4-0  Monocryl sutures in a subcuticular fashion. The skin was dressed with Dermabond.  The patient was taken to the recovery room in stable condition.  Type of repair -primary suture & mesh  Mesh overlap - 5cm  Placement of mesh -  beneath fascia and into peritoneal cavity  Size: 3-10cm    PLAN OF CARE: Discharge to home after PACU  PATIENT DISPOSITION:  PACU - hemodynamically stable.   Delay start of Pharmacological VTE agent (>24hrs) due to surgical blood loss or risk of bleeding: not applicable

## 2023-07-28 NOTE — Anesthesia Postprocedure Evaluation (Signed)
Anesthesia Post Note  Patient: [name]  Procedure(s) Performed: LAPAROSCOPIC INCISIONAL HERNIA X2 WITH MESH (Abdomen) INSERTION OF MESH (Abdomen)     Patient location during evaluation: PACU Anesthesia Type: General Level of consciousness: awake and alert Pain management: pain level controlled Vital Signs Assessment: post-procedure vital signs reviewed and stable Respiratory status: spontaneous breathing, nonlabored ventilation and respiratory function stable Cardiovascular status: blood pressure returned to baseline and stable Postop Assessment: no apparent nausea or vomiting Anesthetic complications: no   No notable events documented.  Last Vitals:  Vitals:   07/28/23 1000 07/28/23 1015  BP: (!) 141/80 (!) 141/87  Pulse: 64 67  Resp: 12 18  Temp:    SpO2: 100% 99%    Last Pain:  Vitals:   07/28/23 1015  TempSrc:   PainSc: 4                  Lowella Curb

## 2023-07-28 NOTE — Anesthesia Procedure Notes (Signed)
Procedure Name: Intubation Date/Time: 07/28/2023 8:38 AM  Performed by: Alease Medina, CRNAPre-anesthesia Checklist: Patient identified, Emergency Drugs available, Suction available and Patient being monitored Patient Re-evaluated:Patient Re-evaluated prior to induction Oxygen Delivery Method: Circle system utilized Preoxygenation: Pre-oxygenation with 100% oxygen Induction Type: IV induction Ventilation: Mask ventilation without difficulty Laryngoscope Size: Mac and 4 Grade View: Grade I Tube type: Oral Tube size: 7.5 mm Number of attempts: 1 Airway Equipment and Method: Stylet and Oral airway Placement Confirmation: ETT inserted through vocal cords under direct vision, positive ETCO2 and breath sounds checked- equal and bilateral Secured at: 22 cm Tube secured with: Tape Dental Injury: Teeth and Oropharynx as per pre-operative assessment

## 2023-07-28 NOTE — Discharge Instructions (Signed)

## 2023-07-28 NOTE — Anesthesia Preprocedure Evaluation (Signed)
Anesthesia Evaluation  Patient identified by MRN, date of birth, ID band Patient awake    Reviewed: Allergy & Precautions, H&P , NPO status , Patient's Chart, lab work & pertinent test results  History of Anesthesia Complications Negative for: history of anesthetic complications  Airway Mallampati: II  TM Distance: >3 FB Neck ROM: Full    Dental no notable dental hx. (+)    Pulmonary asthma , pneumonia Childhood asthma   Pulmonary exam normal breath sounds clear to auscultation       Cardiovascular Exercise Tolerance: Good hypertension, Pt. on medications Normal cardiovascular exam Rhythm:Regular Rate:Normal  whitecoat HTN, not currently taking any medications   Neuro/Psych  Headaches      Dementia  negative psych ROS   GI/Hepatic negative GI ROS, Neg liver ROS,,,  Endo/Other  negative endocrine ROS    Renal/GU negative Renal ROS     Musculoskeletal  (+) Arthritis , Osteoarthritis,    Abdominal   Peds  Hematology negative hematology ROS (+)   Anesthesia Other Findings   Reproductive/Obstetrics negative OB ROS                             Anesthesia Physical Anesthesia Plan  ASA: 3  Anesthesia Plan: General   Post-op Pain Management: Tylenol PO (pre-op)* and Dilaudid IV   Induction: Intravenous  PONV Risk Score and Plan: 2 and Ondansetron, Midazolam and Treatment may vary due to age or medical condition  Airway Management Planned: Oral ETT  Additional Equipment:   Intra-op Plan:   Post-operative Plan: Extubation in OR  Informed Consent: I have reviewed the patients History and Physical, chart, labs and discussed the procedure including the risks, benefits and alternatives for the proposed anesthesia with the patient or authorized representative who has indicated his/her understanding and acceptance.     Dental advisory given  Plan Discussed with: CRNA  Anesthesia Plan  Comments:         Anesthesia Quick Evaluation

## 2023-07-29 ENCOUNTER — Encounter (HOSPITAL_COMMUNITY): Payer: Self-pay | Admitting: General Surgery

## 2023-08-12 DIAGNOSIS — Z9889 Other specified postprocedural states: Secondary | ICD-10-CM | POA: Diagnosis not present

## 2023-08-12 DIAGNOSIS — Z8719 Personal history of other diseases of the digestive system: Secondary | ICD-10-CM | POA: Diagnosis not present

## 2023-08-26 ENCOUNTER — Ambulatory Visit (INDEPENDENT_AMBULATORY_CARE_PROVIDER_SITE_OTHER): Payer: Medicare Other | Admitting: Family Medicine

## 2023-08-26 ENCOUNTER — Encounter: Payer: Self-pay | Admitting: Family Medicine

## 2023-08-26 VITALS — BP 112/70 | HR 66 | Temp 97.7°F | Wt 167.4 lb

## 2023-08-26 DIAGNOSIS — T17908A Unspecified foreign body in respiratory tract, part unspecified causing other injury, initial encounter: Secondary | ICD-10-CM | POA: Diagnosis not present

## 2023-08-26 NOTE — Progress Notes (Signed)
   Subjective:    Patient ID: [name], male    DOB: 05-Feb-1946, 78 y.o.   MRN: [id number]  HPI Here to follow up on some recent aspiration problems. On 07-28-23 he had a laparoscopic repair of 2 incisional hernias above the pubis by Dr. Lynda Leos. This went well, and the wounds are healing nicely. However last week he began to experience choking whenever he drank liquids that would make him cough. No trouble swallowing food. He spoke to Dr. Leos about this, and he felt this was the result of swelling and irritation of the posterior pharynx from the endotrachial tube used during his surgery. He gave Eric a Medrol  dose pack which he completed yesterday, and this worked apparently. For the past 2 days he has been swallowing liquids easily with no aspiriation. He does note some hoarseness in his voice for the pasy few days. No ST.    Review of Systems  Constitutional: Negative.   HENT:  Positive for trouble swallowing and voice change. Negative for congestion, postnasal drip, sinus pressure and sore throat.   Eyes: Negative.   Respiratory: Negative.    Cardiovascular: Negative.   Gastrointestinal: Negative.        Objective:   Physical Exam Constitutional:      Appearance: Normal appearance. He is not ill-appearing.  Cardiovascular:     Rate and Rhythm: Normal rate and regular rhythm.     Pulses: Normal pulses.     Heart sounds: Normal heart sounds.  Pulmonary:     Effort: Pulmonary effort is normal.     Breath sounds: Normal breath sounds.  Abdominal:     General: Abdomen is flat. Bowel sounds are normal. There is no distension.     Palpations: Abdomen is soft. There is no mass.     Tenderness: There is no abdominal tenderness. There is no guarding or rebound.     Hernia: No hernia is present.  Lymphadenopathy:     Cervical: No cervical adenopathy.  Neurological:     Mental Status: He is alert.           Assessment & Plan:  He has experienced some aspiration  issues secondary to his surgery, and this has resolved after taking a course of steroids. He likely has also been having some GERD, so he will take an OTC Prilosec daily for a week or two. Hew ill return in a few weeks for a well exam, and we will review this problem then.  Garnette Olmsted, MD

## 2023-09-17 ENCOUNTER — Encounter: Payer: Medicare Other | Admitting: Family Medicine

## 2023-09-17 ENCOUNTER — Encounter: Payer: Self-pay | Admitting: Family Medicine

## 2023-09-17 ENCOUNTER — Ambulatory Visit: Payer: Medicare Other | Admitting: Family Medicine

## 2023-09-17 VITALS — BP 106/60 | HR 63 | Temp 97.8°F | Ht 72.5 in | Wt 168.6 lb

## 2023-09-17 DIAGNOSIS — J4520 Mild intermittent asthma, uncomplicated: Secondary | ICD-10-CM | POA: Diagnosis not present

## 2023-09-17 DIAGNOSIS — C61 Malignant neoplasm of prostate: Secondary | ICD-10-CM | POA: Diagnosis not present

## 2023-09-17 DIAGNOSIS — E785 Hyperlipidemia, unspecified: Secondary | ICD-10-CM | POA: Diagnosis not present

## 2023-09-17 DIAGNOSIS — I10 Essential (primary) hypertension: Secondary | ICD-10-CM

## 2023-09-17 NOTE — Progress Notes (Signed)
Subjective:    Patient ID: [name], male    DOB: 1946/08/17, 78 y.o.   MRN: [id number]  HPI Here to follow up on issues. He feels great. He had a laparoscopic surgery to repair an incisional hernia on 07-28-23, and this went very well. He now has no pain. BM's are regular. His asthma is well controlled. His BP has been normal. He still sees Urology every 6 months for a PSA. He has been taking part in a brain study at [facility] the past year, and he had complete labs in October that were all normal. He also had another brain MRI that was normal. He has about 6 months to go on the study.    Review of Systems  Constitutional: Negative.   HENT: Negative.    Eyes: Negative.   Respiratory: Negative.    Cardiovascular: Negative.   Gastrointestinal: Negative.   Genitourinary: Negative.   Musculoskeletal: Negative.   Skin: Negative.   Neurological: Negative.   Psychiatric/Behavioral: Negative.         Objective:   Physical Exam Constitutional:      General: He is not in acute distress.    Appearance: Normal appearance. He is well-developed. He is not diaphoretic.  HENT:     Head: Normocephalic and atraumatic.     Right Ear: External ear normal.     Left Ear: External ear normal.     Nose: Nose normal.     Mouth/Throat:     Pharynx: No oropharyngeal exudate.  Eyes:     General: No scleral icterus.       Right eye: No discharge.        Left eye: No discharge.     Conjunctiva/sclera: Conjunctivae normal.     Pupils: Pupils are equal, round, and reactive to light.  Neck:     Thyroid: No thyromegaly.     Vascular: No JVD.     Trachea: No tracheal deviation.  Cardiovascular:     Rate and Rhythm: Normal rate and regular rhythm.     Pulses: Normal pulses.     Heart sounds: Normal heart sounds. No murmur heard.    No friction rub. No gallop.  Pulmonary:     Effort: Pulmonary effort is normal. No respiratory distress.     Breath sounds: Normal breath sounds. No wheezing or  rales.  Chest:     Chest wall: No tenderness.  Abdominal:     General: Bowel sounds are normal. There is no distension.     Palpations: Abdomen is soft. There is no mass.     Tenderness: There is no abdominal tenderness. There is no guarding or rebound.  Genitourinary:    Penis: No tenderness.   Musculoskeletal:        General: No tenderness. Normal range of motion.     Cervical back: Neck supple.  Lymphadenopathy:     Cervical: No cervical adenopathy.  Skin:    General: Skin is warm and dry.     Coloration: Skin is not pale.     Findings: No erythema or rash.  Neurological:     General: No focal deficit present.     Mental Status: He is alert and oriented to person, place, and time.     Cranial Nerves: No cranial nerve deficit.     Motor: No abnormal muscle tone.     Coordination: Coordination normal.     Deep Tendon Reflexes: Reflexes are normal and symmetric. Reflexes normal.  Psychiatric:  Mood and Affect: Mood normal.        Behavior: Behavior normal.        Thought Content: Thought content normal.        Judgment: Judgment normal.           Assessment & Plan:  He has recovered nicely from the recent surgery. His HTN is well controlled with diet and exercise. His asthma is stable. He has had normal labs and MRI recently as part of his brain study. He will be due for another Cologuard test next year. We spent a total of ( 32  ) minutes reviewing records and discussing these issues.  Gershon Crane, MD

## 2023-09-22 ENCOUNTER — Encounter: Payer: PRIVATE HEALTH INSURANCE | Admitting: Family Medicine

## 2023-10-08 ENCOUNTER — Ambulatory Visit: Payer: Medicare Other | Admitting: Family Medicine

## 2023-10-08 ENCOUNTER — Encounter: Payer: Self-pay | Admitting: Family Medicine

## 2023-10-08 ENCOUNTER — Ambulatory Visit: Payer: Medicare Other

## 2023-10-08 VITALS — Ht 72.0 in | Wt 168.0 lb

## 2023-10-08 VITALS — BP 126/70 | HR 66 | Temp 99.2°F | Ht 73.0 in | Wt 167.2 lb

## 2023-10-08 DIAGNOSIS — R0602 Shortness of breath: Secondary | ICD-10-CM | POA: Diagnosis not present

## 2023-10-08 DIAGNOSIS — J101 Influenza due to other identified influenza virus with other respiratory manifestations: Secondary | ICD-10-CM | POA: Diagnosis not present

## 2023-10-08 DIAGNOSIS — R051 Acute cough: Secondary | ICD-10-CM

## 2023-10-08 DIAGNOSIS — Z0000 Encounter for general adult medical examination without abnormal findings: Secondary | ICD-10-CM

## 2023-10-08 DIAGNOSIS — R6889 Other general symptoms and signs: Secondary | ICD-10-CM

## 2023-10-08 LAB — POCT INFLUENZA A/B
Influenza A, POC: POSITIVE — AB
Influenza B, POC: NEGATIVE

## 2023-10-08 MED ORDER — OSELTAMIVIR PHOSPHATE 75 MG PO CAPS: 75.0000 mg | ORAL_CAPSULE | Freq: Two times a day (BID) | ORAL | 0 refills | Status: AC

## 2023-10-08 MED ORDER — ALBUTEROL SULFATE HFA 108 (90 BASE) MCG/ACT IN AERS: 2.0000 | INHALATION_SPRAY | Freq: Four times a day (QID) | RESPIRATORY_TRACT | 0 refills | Status: AC | PRN

## 2023-10-08 NOTE — Patient Instructions (Addendum)
[name] , Thank you for taking time to come for your Medicare Wellness Visit. I appreciate your ongoing commitment to your health goals. Please review the following plan we discussed and let me know if I can assist you in the future.   Referrals/Orders/Follow-Ups/Clinician Recommendations:   This is a list of the screening recommended for you and due dates:  Health Maintenance  Topic Date Due   Hepatitis C Screening  Never done   Zoster (Shingles) Vaccine (2 of 2) 11/03/2022   COVID-19 Vaccine (8 - 2024-25 season) 07/26/2023   Medicare Annual Wellness Visit  10/07/2024   DTaP/Tdap/Td vaccine (2 - Tdap) 10/10/2029   Pneumonia Vaccine  Completed   Flu Shot  Completed   HPV Vaccine  Aged Out   Colon Cancer Screening  Discontinued   Cologuard (Stool DNA test)  Discontinued    Advanced directives: (Copy Requested) Please bring a copy of your health care power of attorney and living will to the office to be added to your chart at your convenience.  Next Medicare Annual Wellness Visit scheduled for next year: Yes

## 2023-10-08 NOTE — Progress Notes (Signed)
Acute Office Visit   Subjective:  Patient ID: [name], male    DOB: [birth year], 79 y.o.   MRN: [id number]  Chief Complaint  Patient presents with   Cough    Chills, body aches and runny nose started Wednesday    HPI: Patient is complaining of the following symptoms:  Nasal drainage Chills Cough-non productive Body Aches SHOB  Denies fever, CP, ear pain/discharge, sore throat, nausea, vomiting, or headache.   Symptoms started on Wednesday.   He has been taking OTC cold medication.   Review of Systems  Respiratory:  Positive for cough.    See HPI above      Objective:   BP 126/70 (BP Location: Left Arm, Patient Position: Sitting, Cuff Size: Normal)   Pulse 66   Temp 99.2 F (37.3 C) (Oral)   Ht 6\' 1"  (1.854 m)   Wt 167 lb 3.2 oz (75.8 kg)   SpO2 94%   BMI 22.06 kg/m    Physical Exam Vitals reviewed.  Constitutional:      General: He is not in acute distress.    Appearance: Normal appearance. He is ill-appearing. He is not toxic-appearing or diaphoretic.  HENT:     Head: Normocephalic and atraumatic.     Right Ear: There is impacted cerumen.     Left Ear: Tympanic membrane and ear canal normal.     Ears:     Comments: Mild to moderate amount of cerumen in left canal.     Nose:     Right Sinus: No maxillary sinus tenderness or frontal sinus tenderness.     Left Sinus: No maxillary sinus tenderness or frontal sinus tenderness.     Mouth/Throat:     Pharynx: Oropharynx is clear. Uvula midline. No pharyngeal swelling, oropharyngeal exudate, posterior oropharyngeal erythema or uvula swelling.  Eyes:     General:        Right eye: No discharge.        Left eye: No discharge.     Conjunctiva/sclera: Conjunctivae normal.  Cardiovascular:     Rate and Rhythm: Normal rate and regular rhythm.     Heart sounds: Normal heart sounds. No murmur heard.    No friction rub. No gallop.  Pulmonary:     Effort: Pulmonary effort is normal. No respiratory distress.      Breath sounds: Normal breath sounds.     Comments: Non productive during visit  Musculoskeletal:        General: Normal range of motion.  Skin:    General: Skin is warm and dry.  Neurological:     General: No focal deficit present.     Mental Status: He is alert and oriented to person, place, and time. Mental status is at baseline.  Psychiatric:        Mood and Affect: Mood normal.        Behavior: Behavior normal.        Thought Content: Thought content normal.        Judgment: Judgment normal.     Latest Reference Range & Units 10/08/23 16:06  Influenza A, POC Negative  Positive !  Influenza B, POC Negative  Negative  !: Data is abnormal     Assessment & Plan:  Influenza A -     Oseltamivir Phosphate; Take 1 capsule (75 mg total) by mouth 2 (two) times daily for 5 days.  Dispense: 10 capsule; Refill: 0  Flu-like symptoms -     POCT Influenza A/B  Acute  cough -     Albuterol Sulfate HFA; Inhale 2 puffs into the lungs every 6 (six) hours as needed for wheezing or shortness of breath (cough).  Dispense: 8 g; Refill: 0  Shortness of breath -     Albuterol Sulfate HFA; Inhale 2 puffs into the lungs every 6 (six) hours as needed for wheezing or shortness of breath (cough).  Dispense: 8 g; Refill: 0  -Influenza POSITIVE -Provided information about influenza. Recommend supportive care for symptoms, such as Tylenol or Ibuprofen for fever or body aches. -Rest, hydrate. -Prescribed Tamiflu 75mg  tablet, take 1 tablet twice a day.  -Prescribed Albuterol Inhaler for shortness of breath and cough. Inhale 2 puffs every 6 hours as needed.  -Follow up if not improved or symptoms become worse.   Zandra Abts, NP

## 2023-10-08 NOTE — Progress Notes (Signed)
Subjective:   [name] is a 78 y.o. male who presents for Medicare Annual/Subsequent preventive examination.  Visit Complete: Virtual I connected with  [name] on 10/08/23 by a audio enabled telemedicine application and verified that I am speaking with the correct person using two identifiers.  Patient Location: Home  Provider Location: Home Office  I discussed the limitations of evaluation and management by telemedicine. The patient expressed understanding and agreed to proceed.  Vital Signs: Because this visit was a virtual/telehealth visit, some criteria may be missing or patient reported. Any vitals not documented were not able to be obtained and vitals that have been documented are patient reported.  Patient Medicare AWV questionnaire was completed by the patient on 10/04/23; I have confirmed that all information answered by patient is correct and no changes since this date.  Cardiac Risk Factors include: advanced age (>3men, >86 women);male gender;hypertension     Objective:    Today's Vitals   10/08/23 1414  Weight: 168 lb (76.2 kg)  Height: 6' (1.829 m)   Body mass index is 22.78 kg/m.     10/08/2023    2:23 PM 07/21/2023    8:18 AM 09/28/2022   12:40 PM 09/15/2021    4:07 PM 11/20/2020   12:32 PM 09/12/2020    9:56 AM 09/28/2017    9:53 PM  Advanced Directives  Does Patient Have a Medical Advance Directive? Yes Yes Yes Yes No Yes No  Type of Estate agent of [place];Living will Living will Healthcare Power of [place];Living will Healthcare Power of [place];Living will  Healthcare Power of [place];Living will   Does patient want to make changes to medical advance directive?  No - Patient declined    No - Patient declined   Copy of Healthcare Power of Attorney in Chart? No - copy requested  No - copy requested No - copy requested  No - copy requested   Would patient like information on creating a medical advance directive?     No -  Guardian declined  No - Patient declined    Current Medications (verified) Outpatient Encounter Medications as of 10/08/2023  Medication Sig   Cranberry (AZO CRANBERRY GUMMIES) 250 MG CHEW Chew 2 each by mouth at bedtime.   fexofenadine (ALLEGRA) 180 MG tablet Take 180 mg by mouth daily.   Investigational - Study Medication Take 2 tablets by mouth daily. Study name: CT 1812 Additional study details: For Alzheimer's   MAGNESIUM PO Take 500 mg by mouth at bedtime.   MELATONIN GUMMIES PO Take 8 mg by mouth at bedtime as needed (sleep). With CBD   OVER THE COUNTER MEDICATION Take 2 each by mouth daily. [place] Mushroom   Tadalafil 2.5 MG TABS Take 2.5 mg by mouth daily as needed (ED).   No facility-administered encounter medications on file as of 10/08/2023.    Allergies (verified) Lisinopril, Nitrofurantoin, Penicillins, Doxycycline, and Levaquin [levofloxacin]   History: Past Medical History:  Diagnosis Date   Arthritis    hands and spine    Asthma    as a child    Dementia (HCC)    Elevated PSA    Erectile dysfunction    History of basal cell carcinoma excision    x2  scalp and head   History of epididymitis    Male stress incontinence    Pneumonia    hx of in 1968   Prostate cancer (HCC) 05/18/2014   Gleason 4+3=7   Wears contact lenses  White coat hypertension    Past Surgical History:  Procedure Laterality Date   COLONOSCOPY  11/28/2020   negative Cologuard   INCISIONAL HERNIA REPAIR N/A 07/28/2023   Procedure: LAPAROSCOPIC INCISIONAL HERNIA X2 WITH MESH;  Surgeon: Axel Filler, MD;  Location: [facility] OR;  Service: General;  Laterality: N/A;   INSERTION OF MESH N/A 07/28/2023   Procedure: INSERTION OF MESH;  Surgeon: Axel Filler, MD;  Location: [facility] OR;  Service: General;  Laterality: N/A;   LYMPHADENECTOMY Bilateral 08/02/2014   Procedure: Hart Carwin;  Surgeon: Heloise Purpura, MD;  Location: [facility];  Service: Urology;  Laterality: Bilateral;   PROSTATE  BIOPSY N/A 05/18/2014   Procedure: TRANSRECTAL ULTRASOUND OF THE PROSTATE/BIOPSY;  Surgeon: Crist Fat, MD;  Location: [facility];  Service: Urology;  Laterality: N/A;   ROBOT ASSISTED LAPAROSCOPIC RADICAL PROSTATECTOMY N/A 08/02/2014   Procedure: ROBOTIC ASSISTED LAPAROSCOPIC RADICAL PROSTATECTOMY LEVEL 2;  Surgeon: Heloise Purpura, MD;  Location: [facility];  Service: Urology;  Laterality: N/A;   Family History  Problem Relation Age of Onset   Dementia Mother    Stroke Father    Cancer Sister        breast, twin sister   Dementia Maternal Aunt    Dementia Maternal Uncle    Social History   Socioeconomic History   Marital status: Married    Spouse name: Not on file   Number of children: Not on file   Years of education: Not on file   Highest education level: Some college, no degree  Occupational History   Not on file  Tobacco Use   Smoking status: Never   Smokeless tobacco: Never  Vaping Use   Vaping status: Never Used  Substance and Sexual Activity   Alcohol use: Not Currently   Drug use: No   Sexual activity: Not on file  Other Topics Concern   Not on file  Social History Narrative   Not on file   Social Drivers of Health   Financial Resource Strain: Low Risk  (10/08/2023)   Overall Financial Resource Strain (CARDIA)    Difficulty of Paying Living Expenses: Not hard at all  Food Insecurity: No Food Insecurity (10/08/2023)   Hunger Vital Sign    Worried About Running Out of Food in the Last Year: Never true    Ran Out of Food in the Last Year: Never true  Transportation Needs: No Transportation Needs (10/08/2023)   PRAPARE - Administrator, Civil Service (Medical): No    Lack of Transportation (Non-Medical): No  Physical Activity: Insufficiently Active (10/08/2023)   Exercise Vital Sign    Days of Exercise per Week: 2 days    Minutes of Exercise per Session: 60 min  Stress: No Stress Concern Present (10/08/2023)   [employer] of  Occupational Health - Occupational Stress Questionnaire    Feeling of Stress : Not at all  Social Connections: Moderately Isolated (10/08/2023)   Social Connection and Isolation Panel [NHANES]    Frequency of Communication with Friends and Family: More than three times a week    Frequency of Social Gatherings with Friends and Family: More than three times a week    Attends Religious Services: Never    Database administrator or Organizations: No    Attends Banker Meetings: Never    Marital Status: Married    Tobacco Counseling Counseling given: Not Answered   Clinical Intake:  Pre-visit preparation completed: Yes  Pain : No/denies pain  BMI - recorded: 22.78 Nutritional Status: BMI of 19-24  Normal Nutritional Risks: None Diabetes: No  How often do you need to have someone help you when you read instructions, pamphlets, or other written materials from your doctor or pharmacy?: 1 - Never  Interpreter Needed?: No  Information entered by :: Theresa Mulligan LPN   Activities of Daily Living    10/08/2023    2:21 PM 10/04/2023    8:44 AM  In your present state of health, do you have any difficulty performing the following activities:  Hearing? 1 0  Comment Wears Hearing Aids   Vision? 0 0  Difficulty concentrating or making decisions? 0 0  Walking or climbing stairs? 0 0  Dressing or bathing? 0 0  Doing errands, shopping? 0 0  Preparing Food and eating ? N N  Using the Toilet? N N  In the past six months, have you accidently leaked urine? Malvin Johns  Comment Wears Pads and Breifs. Followed by Urologist   Do you have problems with loss of bowel control? N N  Managing your Medications? N N  Managing your Finances? N N  Housekeeping or managing your Housekeeping? N N    Patient Care Team: Nelwyn Salisbury, MD as PCP - General  Indicate any recent Medical Services you may have received from other than [facility] providers in the past year (date may be  approximate).     Assessment:   This is a routine wellness examination for [place].  Hearing/Vision screen Hearing Screening - Comments:: Wears Hearing Aids Vision Screening - Comments:: Wears rx glasses - up to date with routine eye exams with  Dr Fredrich Birks   Goals Addressed               This Visit's Progress     Stay Active (pt-stated)         Depression Screen    10/08/2023    2:20 PM 02/19/2023    9:30 AM 09/28/2022   12:37 PM 02/05/2022    9:54 AM 09/15/2021    4:09 PM 09/12/2020    9:57 AM 10/11/2019    8:54 AM  PHQ 2/9 Scores  PHQ - 2 Score 0 1 0 0 0 0 0  PHQ- 9 Score  3  0       Fall Risk    10/08/2023    2:22 PM 10/04/2023    8:44 AM 02/19/2023    9:30 AM 09/28/2022   12:40 PM 09/27/2022    1:58 PM  Fall Risk   Falls in the past year? 0 0 0 0 0  Number falls in past yr: 0  0 0   Injury with Fall? 0  0 0   Risk for fall due to : No Fall Risks  No Fall Risks No Fall Risks   Follow up Falls prevention discussed;Falls evaluation completed  Falls evaluation completed Falls prevention discussed     MEDICARE RISK AT HOME: Medicare Risk at Home Any stairs in or around the home?: Yes If so, are there any without handrails?: No Home free of loose throw rugs in walkways, pet beds, electrical cords, etc?: Yes Adequate lighting in your home to reduce risk of falls?: Yes Life alert?: No Use of a cane, walker or w/c?: No Grab bars in the bathroom?: No Shower chair or bench in shower?: No Elevated toilet seat or a handicapped toilet?: No  TIMED UP AND GO:  Was the test performed?  No  Cognitive Function:        10/08/2023    2:23 PM 09/28/2022   12:40 PM 09/12/2020    9:59 AM  6CIT Screen  What Year? 0 points 0 points 0 points  What month? 0 points 0 points 0 points  What time? 0 points 0 points 0 points  Count back from 20 0 points 0 points 0 points  Months in reverse 0 points 0 points 0 points  Repeat phrase 10 points 4 points 2 points  Total Score 10  points 4 points 2 points    Immunizations Immunization History  Administered Date(s) Administered   Fluad Quad(high Dose 65+) 04/24/2019   Fluad Trivalent(High Dose 65+) 05/31/2023   Hepatitis A, Adult 01/08/2014, 07/26/2014   Influenza, High Dose Seasonal PF 06/15/2017, 05/28/2020, 06/06/2021, 05/26/2022   Influenza-Unspecified 06/16/2014, 05/06/2015, 05/02/2018, 04/25/2019   Moderna Covid-19 Vaccine Bivalent Booster 38yrs & up 05/26/2022   PFIZER Comirnaty(Gray Top)Covid-19 Tri-Sucrose Vaccine 12/12/2020   PFIZER(Purple Top)SARS-COV-2 Vaccination 09/30/2019, 10/24/2019, 05/28/2020   Pfizer Covid-19 Vaccine Bivalent Booster 53yrs & up 12/01/2021   Pfizer(Comirnaty)Fall Seasonal Vaccine 12 years and older 05/31/2023   Pneumococcal Conjugate-13 07/09/2017   Pneumococcal Polysaccharide-23 05/04/2019   Td 10/11/2019   Zoster Recombinant(Shingrix) 09/08/2022    TDAP status: Up to date  Flu Vaccine status: Up to date  Pneumococcal vaccine status: Up to date  Covid-19 vaccine status: Declined, Education has been provided regarding the importance of this vaccine but patient still declined. Advised may receive this vaccine at local pharmacy or Health Dept.or vaccine clinic. Aware to provide a copy of the vaccination record if obtained from local pharmacy or Health Dept. Verbalized acceptance and understanding.  Qualifies for Shingles Vaccine? Yes   Zostavax completed No   Shingrix Completed?: No.    Education has been provided regarding the importance of this vaccine. Patient has been advised to call insurance company to determine out of pocket expense if they have not yet received this vaccine. Advised may also receive vaccine at local pharmacy or Health Dept. Verbalized acceptance and understanding.  Screening Tests Health Maintenance  Topic Date Due   Hepatitis C Screening  Never done   Zoster Vaccines- Shingrix (2 of 2) 11/03/2022   COVID-19 Vaccine (8 - 2024-25 season) 07/26/2023    Medicare Annual Wellness (AWV)  10/07/2024   DTaP/Tdap/Td (2 - Tdap) 10/10/2029   Pneumonia Vaccine 64+ Years old  Completed   INFLUENZA VACCINE  Completed   HPV VACCINES  Aged Out   Colonoscopy  Discontinued   Fecal DNA (Cologuard)  Discontinued    Health Maintenance  Health Maintenance Due  Topic Date Due   Hepatitis C Screening  Never done   Zoster Vaccines- Shingrix (2 of 2) 11/03/2022   COVID-19 Vaccine (8 - 2024-25 season) 07/26/2023        Additional Screening:  Hepatitis C Screening: does qualify; Deferred  Vision Screening: Recommended annual ophthalmology exams for early detection of glaucoma and other disorders of the eye. Is the patient up to date with their annual eye exam?  Yes  Who is the provider or what is the name of the office in which the patient attends annual eye exams? Dr Fredrich Birks If pt is not established with a provider, would they like to be referred to a provider to establish care? No .   Dental Screening: Recommended annual dental exams for proper oral hygiene    Community Resource Referral / Chronic Care Management:  CRR required this visit?  No  CCM required this visit?  No     Plan:     I have personally reviewed and noted the following in the patient's chart:   Medical and social history Use of alcohol, tobacco or illicit drugs  Current medications and supplements including opioid prescriptions. Patient is not currently taking opioid prescriptions. Functional ability and status Nutritional status Physical activity Advanced directives List of other physicians Hospitalizations, surgeries, and ER visits in previous 12 months Vitals Screenings to include cognitive, depression, and falls Referrals and appointments  In addition, I have reviewed and discussed with patient certain preventive protocols, quality metrics, and best practice recommendations. A written personalized care plan for preventive services as well as general  preventive health recommendations were provided to patient.     Tillie Rung, LPN   6/96/2952   After Visit Summary: (MyChart) Due to this being a telephonic visit, the after visit summary with patients personalized plan was offered to patient via MyChart   Nurse Notes: None

## 2023-10-08 NOTE — Patient Instructions (Addendum)
-  Influenza POSITIVE -Provided information about influenza. Recommend supportive care for symptoms, such as Tylenol or Ibuprofen for fever or body aches. -Rest, hydrate. -Prescribed Tamiflu 75mg  tablet, take 1 tablet twice a day.  -Prescribed Albuterol Inhaler for shortness of breath and cough. Inhale 2 puffs every 6 hours as needed.  -Follow up if not improved or symptoms become worse.

## 2023-11-09 ENCOUNTER — Telehealth: Payer: Self-pay

## 2023-11-09 NOTE — Telephone Encounter (Signed)
 Copied from CRM [id number]. Topic: General - Other >> Nov 09, 2023  3:19 PM [name] wrote: Reason for CRM: Patient received a message from Mychart stating he was over due for the second dose of his shingles vaccine. He does not remember when the first one was given and would like to know.

## 2023-11-10 NOTE — Telephone Encounter (Signed)
 Advised pt of the date for his 1 st Shingles vaccine advised to go to his pharmacy for the 2nd dose

## 2023-11-21 ENCOUNTER — Encounter: Payer: Self-pay | Admitting: Family Medicine

## 2023-11-21 DIAGNOSIS — Z1211 Encounter for screening for malignant neoplasm of colon: Secondary | ICD-10-CM

## 2023-11-22 NOTE — Telephone Encounter (Signed)
 I think that would be a good idea (it has been 3 years). I will place the order

## 2023-12-17 NOTE — Telephone Encounter (Signed)
 Spoke with pt aware that the [facility] will be mailing the cologuard to pt address

## 2024-01-02 LAB — COLOGUARD: COLOGUARD: NEGATIVE

## 2024-01-10 ENCOUNTER — Ambulatory Visit: Payer: Self-pay | Admitting: Family Medicine

## 2024-01-31 ENCOUNTER — Ambulatory Visit (INDEPENDENT_AMBULATORY_CARE_PROVIDER_SITE_OTHER): Admitting: Family Medicine

## 2024-01-31 ENCOUNTER — Ambulatory Visit: Payer: Self-pay

## 2024-01-31 ENCOUNTER — Encounter: Payer: Self-pay | Admitting: Family Medicine

## 2024-01-31 VITALS — BP 118/60 | HR 59 | Temp 98.1°F | Wt 168.6 lb

## 2024-01-31 DIAGNOSIS — R739 Hyperglycemia, unspecified: Secondary | ICD-10-CM

## 2024-01-31 DIAGNOSIS — G629 Polyneuropathy, unspecified: Secondary | ICD-10-CM | POA: Diagnosis not present

## 2024-01-31 DIAGNOSIS — R7989 Other specified abnormal findings of blood chemistry: Secondary | ICD-10-CM

## 2024-01-31 NOTE — Telephone Encounter (Signed)
 Noted

## 2024-01-31 NOTE — Telephone Encounter (Signed)
 Copied from CRM [id number]. Topic: Clinical - Red Word Triage >> Jan 31, 2024  9:33 AM [name] wrote: Red Word that prompted transfer to Nurse Triage: Patient is having pain his third toe on each foot that goes up his leg.   FYI Only or Action Required?: FYI only for provider  Patient was last seen in primary care on 10/08/2023 by Francenia Ingle, NP. Called Nurse Triage reporting No chief complaint on file.. Symptoms began a week ago. Interventions attempted: Nothing. Symptoms are: unchanged.  Triage Disposition: See PCP Within 2 Weeks  Patient/caregiver understands and will follow disposition?: Yes   Reason for Disposition  [1] MILD pain (e.g., does not interfere with normal activities) AND [2] present > 7 days  Answer Assessment - Initial Assessment Questions 1. ONSET: "When did the pain start?"      1 week 2. LOCATION: "Where is the pain located?"   (e.g., around nail, entire toe, at foot joint)      3rd toe on each foot  3. PAIN: "How bad is the pain?"    (Scale 1-10; or mild, moderate, severe)   -  MILD (1-3): doesn't interfere with normal activities    -  MODERATE (4-7): interferes with normal activities (e.g., work or school) or awakens from sleep, limping    -  SEVERE (8-10): excruciating pain, unable to do any normal activities, unable to walk     Mild 4. APPEARANCE: "What does the toe look like?" (e.g., redness, swelling, bruising, pallor)     Darker bands on the effected toes.  5. CAUSE: "What do you think is causing the toe pain?"     Unsure  6. OTHER SYMPTOMS: "Do you have any other symptoms?" (e.g., leg pain, rash, fever, numbness)     No  Protocols used: Toe Pain-A-AH

## 2024-01-31 NOTE — Progress Notes (Signed)
   Subjective:    Patient ID: [name], male    DOB: 1946/03/30, 78 y.o.   MRN: [id number]  HPI Here for one week of intermittent numbness and tingling in all 10 toes, but most prominently in the 3rd toe of each foot. Sometimes the toes turn a slightly lighter pink or darker pink then usual, never gray or white. There is no pain.    Review of Systems  Constitutional: Negative.   Respiratory: Negative.    Cardiovascular: Negative.   Neurological:  Positive for numbness.       Objective:   Physical Exam Constitutional:      Appearance: Normal appearance.  Cardiovascular:     Rate and Rhythm: Normal rate and regular rhythm.     Pulses: Normal pulses.     Heart sounds: Normal heart sounds.  Pulmonary:     Effort: Pulmonary effort is normal.     Breath sounds: Normal breath sounds.  Skin:    Comments: Both feet and all toes are warm and pink. Capillary refill is normal. Sensation is normal to light touch. DP and PT pulses are full   Neurological:     Mental Status: He is alert.           Assessment & Plan:  Tingling in the toes. The two most likely etiologies would be either a neuropathy or Reynaud's syndrome. We will get labs today including B12 and A1c. Corita Diego, MD

## 2024-02-04 ENCOUNTER — Ambulatory Visit: Payer: Self-pay | Admitting: Family Medicine

## 2024-02-10 ENCOUNTER — Ambulatory Visit: Admitting: Family Medicine

## 2024-02-15 NOTE — Telephone Encounter (Signed)
 Yes the eosinophil count often goes up in people with allergy issues. Nothing to worry about. As far as the liver levels, I would recommend he take Ibuprofen instead of Tylenol 

## 2024-07-05 ENCOUNTER — Ambulatory Visit: Admitting: Family Medicine

## 2024-07-05 ENCOUNTER — Encounter: Payer: Self-pay | Admitting: Family Medicine

## 2024-07-05 ENCOUNTER — Ambulatory Visit: Payer: Self-pay

## 2024-07-05 VITALS — BP 138/82 | HR 60 | Temp 97.9°F | Resp 16 | Ht 73.0 in | Wt 177.1 lb

## 2024-07-05 DIAGNOSIS — W540XXA Bitten by dog, initial encounter: Secondary | ICD-10-CM | POA: Diagnosis not present

## 2024-07-05 DIAGNOSIS — S61452A Open bite of left hand, initial encounter: Secondary | ICD-10-CM | POA: Diagnosis not present

## 2024-07-05 DIAGNOSIS — S61451A Open bite of right hand, initial encounter: Secondary | ICD-10-CM

## 2024-07-05 MED ORDER — METRONIDAZOLE 500 MG PO TABS: 500.0000 mg | ORAL_TABLET | Freq: Three times a day (TID) | ORAL | 0 refills | Status: AC

## 2024-07-05 MED ORDER — SULFAMETHOXAZOLE-TRIMETHOPRIM 800-160 MG PO TABS: 1.0000 | ORAL_TABLET | Freq: Two times a day (BID) | ORAL | 0 refills | Status: AC

## 2024-07-05 NOTE — Telephone Encounter (Signed)
 Noted

## 2024-07-05 NOTE — Patient Instructions (Signed)
 A few things to remember from today's visit:  Dog bite, hand, left, initial encounter - Plan: sulfamethoxazole -trimethoprim  (BACTRIM  DS) 800-160 MG tablet, metroNIDAZOLE (FLAGYL) 500 MG tablet  Take antibiotics with food. Monitor for signs of infection. Keep it clean with soap and water .  If you need refills for medications you take chronically, please call your pharmacy. Do not use My Chart to request refills or for acute issues that need immediate attention. If you send a my chart message, it may take a few days to be addressed, specially if I am not in the office.  Please be sure medication list is accurate. If a new problem present, please set up appointment sooner than planned today.

## 2024-07-05 NOTE — Telephone Encounter (Signed)
 FYI Only or Action Required?: FYI only for provider: appointment scheduled on 07/05/24.  Patient was last seen in primary care on 01/31/2024 by Johnny Garnette LABOR, MD.  Called Nurse Triage reporting Animal Bite.  Symptoms began several days ago.  Interventions attempted: OTC medications: Triple Antibiotic Ointment.  Symptoms are: gradually worsening.  Triage Disposition: See HCP Within 4 Hours (Or PCP Triage)  Patient/caregiver understands and will follow disposition?: Yes     Copied from CRM [id number]. Topic: Clinical - Red Word Triage >> Jul 05, 2024  8:08 AM Aleatha BROCKS wrote: Red Word that prompted transfer to Nurse Triage: Bite by dog, hand swelling Reason for Disposition  [1] Puncture wound or small cut AND [2] on hands or genitals  (Exception: Puncture  from small pet such as gerbil, mouse, hamster, puppy or turtle.)  Answer Assessment - Initial Assessment Questions 1. APPEARANCE What does it look like?  (e.g., abrasion, bruise, puncture)      Puncture marks, redness, still mild bleeding when irritated, swelling 2. SIZE: How big is the bite? (e.g., inches, cm; or compare to size of coin, pea, grape, ping pong ball)     6 puncture wounds size of pencil head to cm 3. LOCATION: Where is the bite located?      Right hand 4. ONSET: When did the bite happen? (e.g., minutes, hours ago)      Sunday 5. ANIMAL: What type of animal caused the bite? Is the injury from a bite or a claw? If the animal is a dog or a cat, ask: Was it a pet or a stray? Was it acting ill or behaving strangely?     Dog, family pet, Sunday 6. RABIES VACCINE: For dog or cat bites, ask: Do you know if the pet is vaccinated against rabies?  (e.g., yes, no, overdue for rabies shot, unknown)     Yes 7. CIRCUMSTANCES: Tell me how this happened.      Pt was playing with the dog and she snapped at me 8. TETANUS: When was your last tetanus booster?     Unknown  Protocols used: Animal Bite-A-AH

## 2024-07-05 NOTE — Progress Notes (Signed)
 ACUTE VISIT Chief Complaint  Patient presents with   Acute Visit    Patient presents today for a dog bite to right hand that occurred on Sunday, 07/02/24. He reports some swelling. Dog is up to date on all vaccines. Pt last tentus vaccine was giving on 12/04/2022. He reports placing some antibacterial ointment on the bite.    Discussed the use of AI scribe software for clinical note transcription with the patient, who gave verbal consent to proceed.  History of Present Illness [name] is a 78 year old male with past medical history significant for HTN, prostate cancer, asthma,seasonal allergies, and asthma who presents with a dog bite on his right hand as described above.  He was bitten by a friend's dog on his right hand on Sunday, November 9th. The dog is generally well-behaved and fully vaccinated. Earlier same day he and his friends were petting the dog, not known to them. Same night he got up to pet the dog and while doing so she unexpectedly bit him. The dog is not sick and has not been aggressive. He cleaned the wound with peroxide immediately after the bite.  Since the bite, he has experienced swelling in the affected area. He has been keeping the wound clean with soap and water , although he did not clean it today.  No numbness or tingling in the hand and he can move all his fingers.  He is right-handed.  He has a known allergy to penicillins and doxycycline.  Tdap vaccination up to date.  Review of Systems  Constitutional:  Negative for activity change, appetite change, chills and fever.  HENT:  Negative for mouth sores and sore throat.   Respiratory:  Negative for cough and shortness of breath.   Cardiovascular:  Negative for chest pain.  Gastrointestinal:  Negative for abdominal pain and nausea.  Skin:  Positive for wound. Negative for rash.  Neurological:  Negative for weakness and headaches.  Hematological:  Negative for adenopathy.  See other pertinent positives  and negatives in HPI.  Current Outpatient Medications on File Prior to Visit  Medication Sig Dispense Refill   albuterol  (VENTOLIN  HFA) 108 (90 Base) MCG/ACT inhaler Inhale 2 puffs into the lungs every 6 (six) hours as needed for wheezing or shortness of breath (cough). 8 g 0   fexofenadine (ALLEGRA) 180 MG tablet Take 180 mg by mouth daily.     Investigational - Study Medication Take 2 tablets by mouth daily. Study name: CT 1812 Additional study details: For Alzheimer's     MAGNESIUM PO Take 500 mg by mouth at bedtime.     OVER THE COUNTER MEDICATION Take 2 each by mouth daily. Lion's Mane Mushroom     No current facility-administered medications on file prior to visit.   Past Medical History:  Diagnosis Date   Arthritis    hands and spine    Asthma    as a child    Dementia (HCC)    Elevated PSA    Erectile dysfunction    History of basal cell carcinoma excision    x2  scalp and head   History of epididymitis    Male stress incontinence    Pneumonia    hx of in 1968   Prostate cancer (HCC) 05/18/2014   Gleason 4+3=7   Wears contact lenses    White coat hypertension    Allergies  Allergen Reactions   Lisinopril  Other (See Comments)    Increased B/P  And tremors  Nitrofurantoin Other (See Comments)    Muscle aches.    Penicillins Other (See Comments)    Unknown childhood reaction   Doxycycline Rash   Levaquin [Levofloxacin] Rash    Social History   Socioeconomic History   Marital status: Married    Spouse name: Not on file   Number of children: Not on file   Years of education: Not on file   Highest education level: Some college, no degree  Occupational History   Not on file  Tobacco Use   Smoking status: Never   Smokeless tobacco: Never  Vaping Use   Vaping status: Never Used  Substance and Sexual Activity   Alcohol use: Not Currently   Drug use: No   Sexual activity: Not on file  Other Topics Concern   Not on file  Social History Narrative   Not  on file   Social Drivers of Health   Financial Resource Strain: Low Risk  (10/08/2023)   Overall Financial Resource Strain (CARDIA)    Difficulty of Paying Living Expenses: Not hard at all  Food Insecurity: No Food Insecurity (10/08/2023)   Hunger Vital Sign    Worried About Running Out of Food in the Last Year: Never true    Ran Out of Food in the Last Year: Never true  Transportation Needs: No Transportation Needs (10/08/2023)   PRAPARE - Administrator, Civil Service (Medical): No    Lack of Transportation (Non-Medical): No  Physical Activity: Insufficiently Active (10/08/2023)   Exercise Vital Sign    Days of Exercise per Week: 2 days    Minutes of Exercise per Session: 60 min  Stress: No Stress Concern Present (10/08/2023)   [employer] of Occupational Health - Occupational Stress Questionnaire    Feeling of Stress : Not at all  Social Connections: Moderately Isolated (10/08/2023)   Social Connection and Isolation Panel    Frequency of Communication with Friends and Family: More than three times a week    Frequency of Social Gatherings with Friends and Family: More than three times a week    Attends Religious Services: Never    Database Administrator or Organizations: No    Attends Banker Meetings: Never    Marital Status: Married   Vitals:   07/05/24 0959  BP: 138/82  Pulse: 60  Resp: 16  Temp: 97.9 F (36.6 C)  SpO2: 98%   Body mass index is 23.37 kg/m.  Physical Exam Vitals and nursing note reviewed.  Constitutional:      General: He is not in acute distress.    Appearance: He is well-developed.  HENT:     Head: Normocephalic and atraumatic.  Eyes:     Conjunctiva/sclera: Conjunctivae normal.  Cardiovascular:     Rate and Rhythm: Normal rate and regular rhythm.  Pulmonary:     Effort: Pulmonary effort is normal. No respiratory distress.  Musculoskeletal:     Right hand: No bony tenderness. Normal range of motion. Normal  strength. Normal sensation. Normal capillary refill. Normal pulse.  Skin:    General: Skin is warm.     Findings: Abrasion present. No erythema or rash.     Comments: Wound on dorsum of right hand between MCP of thumb and index finger. Mild edema, no erythema.Dark crusty areas.  Neurological:     General: No focal deficit present.     Mental Status: He is alert and oriented to person, place, and time.     Gait: Gait  normal.  Psychiatric:        Mood and Affect: Mood and affect normal.     ASSESSMENT AND PLAN:  Mr. Messimer was seen today after right hand dog bite.  Dog bite, hand, left, initial encounter -     Sulfamethoxazole -Trimethoprim ; Take 1 tablet by mouth 2 (two) times daily for 5 days.  Dispense: 10 tablet; Refill: 0 -     metroNIDAZOLE; Take 1 tablet (500 mg total) by mouth 3 (three) times daily for 5 days.  Dispense: 15 tablet; Refill: 0  Open bite of right hand without foreign body, initial encounter  No signs of infection at this time. Recommend continuing keeping wound clean with soap and water , he can gently scrub crusty areas. Keep it uncovered during the day and cover it at night. He has allergy to penicillins and doxycycline, so recommend combination of Bactrim  and metronidazole. We discussed side effects of antibiotics. May need to take a daily probiotic to try to prevent GI symptoms. Last Tdap in 09/2019. Explained that it may take a couple weeks for wound/injury to heal completely.  Clearly instructed about warning signs.   Return in about 2 weeks (around 07/19/2024), or if symptoms worsen or fail to improve.  Niaja Stickley G. Lylianna Fraiser, MD  [facility]. [place] office.

## 2024-07-18 ENCOUNTER — Ambulatory Visit (INDEPENDENT_AMBULATORY_CARE_PROVIDER_SITE_OTHER): Admitting: Adult Health

## 2024-07-18 ENCOUNTER — Encounter: Payer: Self-pay | Admitting: Adult Health

## 2024-07-18 VITALS — BP 136/72 | HR 69 | Temp 97.7°F | Ht 73.0 in | Wt 173.2 lb

## 2024-07-18 DIAGNOSIS — J069 Acute upper respiratory infection, unspecified: Secondary | ICD-10-CM

## 2024-07-18 NOTE — Progress Notes (Signed)
 Subjective:    Patient ID: [name], male    DOB: 1946-03-23, 78 y.o.   MRN: [id number]  HPI  Discussed the use of AI scribe software for clinical note transcription with the patient, who gave verbal consent to proceed.  History of Present Illness   [name] is a 78 year old male who presents with a runny nose and chest discomfort for four weeks.  He has had persistent runny nose with discolored nasal drainage and nonpainful, nonpressure chest discomfort for four weeks. He denies ear pain, fever, chills, cough, shortness of breath, or wheezing. He has not started any new medications or other treatments for these symptoms. He typically has spring hay fever but does not usually have frequent sinus issues. A COVID test this morning was negative, and his symptoms have not improved over the past week.        Review of Systems See HPI   Past Medical History:  Diagnosis Date   Arthritis    hands and spine    Asthma    as a child    Dementia (HCC)    Elevated PSA    Erectile dysfunction    History of basal cell carcinoma excision    x2  scalp and head   History of epididymitis    Male stress incontinence    Pneumonia    hx of in 1968   Prostate cancer (HCC) 05/18/2014   Gleason 4+3=7   Wears contact lenses    White coat hypertension     Social History   Socioeconomic History   Marital status: Married    Spouse name: Not on file   Number of children: Not on file   Years of education: Not on file   Highest education level: Some college, no degree  Occupational History   Not on file  Tobacco Use   Smoking status: Never   Smokeless tobacco: Never  Vaping Use   Vaping status: Never Used  Substance and Sexual Activity   Alcohol use: Not Currently   Drug use: No   Sexual activity: Not on file  Other Topics Concern   Not on file  Social History Narrative   Not on file   Social Drivers of Health   Financial Resource Strain: Low Risk  (10/08/2023)   Overall  Financial Resource Strain (CARDIA)    Difficulty of Paying Living Expenses: Not hard at all  Food Insecurity: No Food Insecurity (10/08/2023)   Hunger Vital Sign    Worried About Running Out of Food in the Last Year: Never true    Ran Out of Food in the Last Year: Never true  Transportation Needs: No Transportation Needs (10/08/2023)   PRAPARE - Administrator, Civil Service (Medical): No    Lack of Transportation (Non-Medical): No  Physical Activity: Insufficiently Active (10/08/2023)   Exercise Vital Sign    Days of Exercise per Week: 2 days    Minutes of Exercise per Session: 60 min  Stress: No Stress Concern Present (10/08/2023)   [employer] of Occupational Health - Occupational Stress Questionnaire    Feeling of Stress : Not at all  Social Connections: Moderately Isolated (10/08/2023)   Social Connection and Isolation Panel    Frequency of Communication with Friends and Family: More than three times a week    Frequency of Social Gatherings with Friends and Family: More than three times a week    Attends Religious Services: Never    Active Member of  Clubs or Organizations: No    Attends Banker Meetings: Never    Marital Status: Married  Catering Manager Violence: Not At Risk (10/08/2023)   Humiliation, Afraid, Rape, and Kick questionnaire    Fear of Current or Ex-Partner: No    Emotionally Abused: No    Physically Abused: No    Sexually Abused: No    Past Surgical History:  Procedure Laterality Date   COLONOSCOPY  11/28/2020   negative Cologuard   INCISIONAL HERNIA REPAIR N/A 07/28/2023   Procedure: LAPAROSCOPIC INCISIONAL HERNIA X2 WITH MESH;  Surgeon: Rubin Calamity, MD;  Location: [facility] OR;  Service: General;  Laterality: N/A;   INSERTION OF MESH N/A 07/28/2023   Procedure: INSERTION OF MESH;  Surgeon: Rubin Calamity, MD;  Location: [facility] OR;  Service: General;  Laterality: N/A;   LYMPHADENECTOMY Bilateral 08/02/2014   Procedure:  REDGIE;  Surgeon: Gretel Ferrara, MD;  Location: [facility] ORS;  Service: Urology;  Laterality: Bilateral;   PROSTATE BIOPSY N/A 05/18/2014   Procedure: TRANSRECTAL ULTRASOUND OF THE PROSTATE/BIOPSY;  Surgeon: Morene LELON Salines, MD;  Location: [facility];  Service: Urology;  Laterality: N/A;   ROBOT ASSISTED LAPAROSCOPIC RADICAL PROSTATECTOMY N/A 08/02/2014   Procedure: ROBOTIC ASSISTED LAPAROSCOPIC RADICAL PROSTATECTOMY LEVEL 2;  Surgeon: Gretel Ferrara, MD;  Location: [facility];  Service: Urology;  Laterality: N/A;    Family History  Problem Relation Age of Onset   Dementia Mother    Stroke Father    Cancer Sister        breast, twin sister   Dementia Maternal Aunt    Dementia Maternal Uncle     Allergies  Allergen Reactions   Lisinopril  Other (See Comments)    Increased B/P  And tremors    Nitrofurantoin Other (See Comments)    Muscle aches.    Penicillins Other (See Comments)    Unknown childhood reaction   Doxycycline Rash   Levaquin [Levofloxacin] Rash    Current Outpatient Medications on File Prior to Visit  Medication Sig Dispense Refill   albuterol  (VENTOLIN  HFA) 108 (90 Base) MCG/ACT inhaler Inhale 2 puffs into the lungs every 6 (six) hours as needed for wheezing or shortness of breath (cough). 8 g 0   fexofenadine (ALLEGRA) 180 MG tablet Take 180 mg by mouth daily.     Investigational - Study Medication Take 2 tablets by mouth daily. Study name: CT 1812 Additional study details: For Alzheimer's     MAGNESIUM PO Take 500 mg by mouth at bedtime.     OVER THE COUNTER MEDICATION Take 2 each by mouth daily. Lion's Mane Mushroom     No current facility-administered medications on file prior to visit.    BP 136/72   Pulse 69   Temp 97.7 F (36.5 C)   Ht 6' 1 (1.854 m)   Wt 173 lb 3.2 oz (78.6 kg)   SpO2 97%   BMI 22.85 kg/m       Objective:   Physical Exam Vitals and nursing note reviewed.  Constitutional:      Appearance: Normal appearance.   HENT:     Nose: Rhinorrhea present. No congestion. Rhinorrhea is clear.     Right Turbinates: Enlarged.     Left Turbinates: Enlarged.     Right Sinus: No maxillary sinus tenderness or frontal sinus tenderness.     Left Sinus: No maxillary sinus tenderness or frontal sinus tenderness.     Mouth/Throat:     Pharynx: Oropharynx is clear. No pharyngeal swelling  or postnasal drip.  Cardiovascular:     Rate and Rhythm: Normal rate and regular rhythm.     Pulses: Normal pulses.     Heart sounds: Normal heart sounds.  Pulmonary:     Effort: Pulmonary effort is normal.     Breath sounds: Normal breath sounds. No stridor. No wheezing, rhonchi or rales.  Musculoskeletal:        General: Normal range of motion.  Lymphadenopathy:     Cervical: No cervical adenopathy.  Skin:    General: Skin is warm and dry.  Neurological:     General: No focal deficit present.     Mental Status: He is alert and oriented to person, place, and time.  Psychiatric:        Mood and Affect: Mood normal.        Behavior: Behavior normal.        Thought Content: Thought content normal.        Judgment: Judgment normal.           Assessment & Plan:  Assessment and Plan    Acute viral upper respiratory infection Likely viral etiology, self-limiting.  - Discussed short course of steroids but he refused at this time.  - Advised symptoms may resolve in 10-14 days without treatment. - Follow up as needed       Milika Ventress, NP

## 2024-10-13 ENCOUNTER — Ambulatory Visit: Payer: Medicare Other
